# Patient Record
Sex: Male | Born: 1955 | Race: White | Hispanic: No | Marital: Married | Smoking: Never smoker
Health system: Southern US, Community
[De-identification: ages and names within clinical notes are randomized; demographics above are authoritative.]

## PROBLEM LIST (undated history)

## (undated) DIAGNOSIS — H9190 Unspecified hearing loss, unspecified ear: Secondary | ICD-10-CM

## (undated) DIAGNOSIS — D126 Benign neoplasm of colon, unspecified: Secondary | ICD-10-CM

## (undated) DIAGNOSIS — E7800 Pure hypercholesterolemia, unspecified: Secondary | ICD-10-CM

## (undated) DIAGNOSIS — N289 Disorder of kidney and ureter, unspecified: Secondary | ICD-10-CM

## (undated) DIAGNOSIS — K529 Noninfective gastroenteritis and colitis, unspecified: Secondary | ICD-10-CM

## (undated) DIAGNOSIS — N189 Chronic kidney disease, unspecified: Secondary | ICD-10-CM

## (undated) HISTORY — DX: Disorder of kidney and ureter, unspecified: N28.9

## (undated) HISTORY — PX: COLONOSCOPY: SHX174

## (undated) HISTORY — DX: Chronic kidney disease, unspecified: N18.9

## (undated) HISTORY — PX: KIDNEY STONE SURGERY: SHX686

## (undated) HISTORY — DX: Pure hypercholesterolemia, unspecified: E78.00

## (undated) HISTORY — DX: Noninfective gastroenteritis and colitis, unspecified: K52.9

## (undated) HISTORY — PX: NASAL SEPTUM SURGERY: SHX37

## (undated) HISTORY — DX: Benign neoplasm of colon, unspecified: D12.6

---

## 1999-02-02 ENCOUNTER — Encounter: Admission: RE | Admit: 1999-02-02 | Discharge: 1999-02-02 | Payer: Self-pay | Admitting: Sports Medicine

## 2000-03-07 ENCOUNTER — Encounter: Admission: RE | Admit: 2000-03-07 | Discharge: 2000-03-07 | Payer: Self-pay | Admitting: Sports Medicine

## 2000-03-08 ENCOUNTER — Ambulatory Visit (HOSPITAL_COMMUNITY): Admission: RE | Admit: 2000-03-08 | Discharge: 2000-03-08 | Payer: Self-pay | Admitting: Sports Medicine

## 2005-10-05 ENCOUNTER — Encounter: Admission: RE | Admit: 2005-10-05 | Discharge: 2005-10-05 | Payer: Self-pay | Admitting: Gastroenterology

## 2014-07-25 DIAGNOSIS — H9190 Unspecified hearing loss, unspecified ear: Secondary | ICD-10-CM

## 2014-07-25 HISTORY — DX: Unspecified hearing loss, unspecified ear: H91.90

## 2015-09-09 ENCOUNTER — Other Ambulatory Visit: Payer: Self-pay | Admitting: Gastroenterology

## 2015-09-14 ENCOUNTER — Other Ambulatory Visit (HOSPITAL_COMMUNITY): Payer: Self-pay | Admitting: Gastroenterology

## 2015-09-14 DIAGNOSIS — R0789 Other chest pain: Secondary | ICD-10-CM

## 2015-09-18 ENCOUNTER — Telehealth (HOSPITAL_COMMUNITY): Payer: Self-pay

## 2015-09-18 NOTE — Telephone Encounter (Signed)
Encounter complete. 

## 2015-09-22 ENCOUNTER — Telehealth (HOSPITAL_COMMUNITY): Payer: Self-pay

## 2015-09-22 NOTE — Telephone Encounter (Signed)
Encounter complete. 

## 2015-09-23 ENCOUNTER — Ambulatory Visit (HOSPITAL_COMMUNITY)
Admission: RE | Admit: 2015-09-23 | Discharge: 2015-09-23 | Disposition: A | Discharge status: Home / Self Care | Payer: BLUE CROSS/BLUE SHIELD | Source: Ambulatory Visit | Attending: Cardiology | Admitting: Cardiology

## 2015-09-23 DIAGNOSIS — R0789 Other chest pain: Secondary | ICD-10-CM | POA: Insufficient documentation

## 2015-09-23 LAB — EXERCISE TOLERANCE TEST
CHL CUP STRESS STAGE 1 DBP: 91 mmHg
CHL CUP STRESS STAGE 1 GRADE: 0 %
CHL CUP STRESS STAGE 1 SBP: 138 mmHg
CHL CUP STRESS STAGE 1 SPEED: 0 mph
CHL CUP STRESS STAGE 10 GRADE: 0 %
CHL CUP STRESS STAGE 10 HR: 72 {beats}/min
CHL CUP STRESS STAGE 10 SBP: 176 mmHg
CHL CUP STRESS STAGE 2 HR: 56 {beats}/min
CHL CUP STRESS STAGE 3 HR: 56 {beats}/min
CHL CUP STRESS STAGE 3 SPEED: 1 mph
CHL CUP STRESS STAGE 4 DBP: 75 mmHg
CHL CUP STRESS STAGE 4 HR: 80 {beats}/min
CHL CUP STRESS STAGE 4 SBP: 140 mmHg
CHL CUP STRESS STAGE 4 SPEED: 1.7 mph
CHL CUP STRESS STAGE 5 DBP: 80 mmHg
CHL CUP STRESS STAGE 5 GRADE: 12 %
CHL CUP STRESS STAGE 5 HR: 91 {beats}/min
CHL CUP STRESS STAGE 5 SBP: 159 mmHg
CHL CUP STRESS STAGE 6 DBP: 76 mmHg
CHL CUP STRESS STAGE 6 SBP: 166 mmHg
CHL CUP STRESS STAGE 7 HR: 123 {beats}/min
CHL CUP STRESS STAGE 7 SPEED: 4.2 mph
CHL CUP STRESS STAGE 8 SBP: 227 mmHg
CHL CUP STRESS STAGE 8 SPEED: 5 mph
CHL CUP STRESS STAGE 9 HR: 120 {beats}/min
CHL CUP STRESS STAGE 9 SPEED: 0 mph
CHL RATE OF PERCEIVED EXERTION: 16
CSEPPMHR: 94 %
Estimated workload: 17.2 METS
Exercise duration (min): 14 min
MPHR: 159 {beats}/min
Peak BP: 227 mmHg
Peak HR: 150 {beats}/min
Percent HR: 94 %
Rest HR: 56 {beats}/min
Stage 1 HR: 56 {beats}/min
Stage 10 DBP: 90 mmHg
Stage 10 Speed: 0 mph
Stage 2 Grade: 0 %
Stage 2 Speed: 1 mph
Stage 3 Grade: 0.1 %
Stage 4 Grade: 10 %
Stage 5 Speed: 2.5 mph
Stage 6 Grade: 14 %
Stage 6 HR: 97 {beats}/min
Stage 6 Speed: 3.4 mph
Stage 7 DBP: 83 mmHg
Stage 7 Grade: 16 %
Stage 7 SBP: 217 mmHg
Stage 8 DBP: 129 mmHg
Stage 8 Grade: 18 %
Stage 8 HR: 150 {beats}/min
Stage 9 DBP: 124 mmHg
Stage 9 Grade: 0 %
Stage 9 SBP: 222 mmHg

## 2015-10-26 ENCOUNTER — Encounter (HOSPITAL_COMMUNITY): Payer: Self-pay | Admitting: *Deleted

## 2015-11-02 ENCOUNTER — Encounter (HOSPITAL_COMMUNITY): Payer: Self-pay | Admitting: *Deleted

## 2015-11-02 ENCOUNTER — Ambulatory Visit (HOSPITAL_COMMUNITY): Payer: BLUE CROSS/BLUE SHIELD | Admitting: Certified Registered Nurse Anesthetist

## 2015-11-02 ENCOUNTER — Encounter (HOSPITAL_COMMUNITY)
Admission: RE | Disposition: A | Discharge status: Home / Self Care | Payer: Self-pay | Source: Ambulatory Visit | Attending: Gastroenterology

## 2015-11-02 ENCOUNTER — Ambulatory Visit (HOSPITAL_COMMUNITY)
Admission: RE | Admit: 2015-11-02 | Discharge: 2015-11-02 | Disposition: A | Discharge status: Home / Self Care | Payer: BLUE CROSS/BLUE SHIELD | Source: Ambulatory Visit | Attending: Gastroenterology | Admitting: Gastroenterology

## 2015-11-02 DIAGNOSIS — Z1211 Encounter for screening for malignant neoplasm of colon: Secondary | ICD-10-CM | POA: Diagnosis not present

## 2015-11-02 DIAGNOSIS — D123 Benign neoplasm of transverse colon: Secondary | ICD-10-CM | POA: Insufficient documentation

## 2015-11-02 HISTORY — DX: Unspecified hearing loss, unspecified ear: H91.90

## 2015-11-02 HISTORY — PX: COLONOSCOPY WITH PROPOFOL: SHX5780

## 2015-11-02 SURGERY — COLONOSCOPY WITH PROPOFOL
Anesthesia: Monitor Anesthesia Care

## 2015-11-02 MED ORDER — LACTATED RINGERS IV SOLN
INTRAVENOUS | Status: DC
  Administered 2015-11-02: 1000 mL via INTRAVENOUS

## 2015-11-02 MED ORDER — ONDANSETRON HCL 4 MG/2ML IJ SOLN
INTRAMUSCULAR | Status: DC | PRN
  Administered 2015-11-02: 4 mg via INTRAVENOUS

## 2015-11-02 MED ORDER — LIDOCAINE HCL (CARDIAC) 20 MG/ML IV SOLN
INTRAVENOUS | Status: AC
  Filled 2015-11-02: qty 5

## 2015-11-02 MED ORDER — PROPOFOL 500 MG/50ML IV EMUL
INTRAVENOUS | Status: DC | PRN
  Administered 2015-11-02: 75 ug/kg/min via INTRAVENOUS

## 2015-11-02 MED ORDER — SODIUM CHLORIDE 0.9 % IV SOLN: INTRAVENOUS | Status: DC

## 2015-11-02 MED ORDER — PROPOFOL 10 MG/ML IV BOLUS
INTRAVENOUS | Status: AC
  Filled 2015-11-02: qty 60

## 2015-11-02 MED ORDER — LIDOCAINE HCL (CARDIAC) 20 MG/ML IV SOLN
INTRAVENOUS | Status: DC | PRN
  Administered 2015-11-02: 100 mg via INTRAVENOUS

## 2015-11-02 MED ORDER — ONDANSETRON HCL 4 MG/2ML IJ SOLN
INTRAMUSCULAR | Status: AC
  Filled 2015-11-02: qty 2

## 2015-11-02 MED ORDER — PROPOFOL 10 MG/ML IV BOLUS
INTRAVENOUS | Status: DC | PRN
  Administered 2015-11-02 (×2): 40 mg via INTRAVENOUS
  Administered 2015-11-02: 30 mg via INTRAVENOUS

## 2015-11-02 SURGICAL SUPPLY — 22 items

## 2015-11-02 NOTE — Op Note (Signed)
[facility] Patient Name: Douglas Kirby Procedure Date: 11/02/2015 MRN: [id number] Attending MD: Garlan Fair , MD Date of Birth: May 16, 1956 CSN:  Age: 60 Admit Type: Outpatient Procedure:                Colonoscopy Indications:              Screening for colorectal malignant neoplasm. Normal                            screening colonoscopy was performed on 09/20/2005. Providers:                Garlan Fair, MD, Laverta Baltimore, RN, Corliss Parish, Technician Referring MD:              Medicines:                Propofol per Anesthesia Complications:            No immediate complications. Estimated Blood Loss:     Estimated blood loss: none. Procedure:                Pre-Anesthesia Assessment:                           - Prior to the procedure, a History and Physical                            was performed, and patient medications and                            allergies were reviewed. The patient's tolerance of                            previous anesthesia was also reviewed. The risks                            and benefits of the procedure and the sedation                            options and risks were discussed with the patient.                            All questions were answered, and informed consent                            was obtained. Prior Anticoagulants: The patient has                            taken no previous anticoagulant or antiplatelet                            agents. ASA Grade Assessment: II - A patient with  mild systemic disease. After reviewing the risks                            and benefits, the patient was deemed in                            satisfactory condition to undergo the procedure.                           After obtaining informed consent, the colonoscope                            was passed under direct vision. Throughout the   procedure, the patient's blood pressure, pulse, and                            oxygen saturations were monitored continuously. The                            [id number] scope was introduced through                            the anus and advanced to the the cecum, identified                            by appendiceal orifice and ileocecal valve. The                            colonoscopy was performed without difficulty. The                            patient tolerated the procedure well. The quality                            of the bowel preparation was good. The terminal                            ileum, the ileocecal valve, the appendiceal orifice                            and the rectum were photographed. Scope In: 9:46:52 AM Scope Out: 10:09:39 AM Scope Withdrawal Time: 0 hours 14 minutes 58 seconds  Total Procedure Duration: 0 hours 22 minutes 47 seconds  Findings:      The perianal and digital rectal examinations were normal.      A 3 mm polyp was found in the proximal transverse colon. The polyp was       sessile. The polyp was removed with a cold biopsy forceps. Resection and       retrieval were complete.      The exam was otherwise without abnormality. Impression:               - One 3 mm polyp in the proximal transverse colon,  removed with a cold biopsy forceps. Resected and                            retrieved.                           - The examination was otherwise normal. Moderate Sedation:      N/A- Per Anesthesia Care Recommendation:           - Patient has a contact number available for                            emergencies. The signs and symptoms of potential                            delayed complications were discussed with the                            patient. Return to normal activities tomorrow.                            Written discharge instructions were provided to the                            patient.                            - Repeat colonoscopy date to be determined after                            pending pathology results are reviewed for                            surveillance based on pathology results.                           - Resume previous diet.                           - Continue present medications. Procedure Code(s):        --- Professional ---                           [phone], Colonoscopy, flexible; with biopsy, single                            or multiple Diagnosis Code(s):        --- Professional ---                           Z12.11, Encounter for screening for malignant                            neoplasm of colon                           D12.3, Benign neoplasm of transverse colon (hepatic  flexure or splenic flexure) CPT copyright 2016 American Medical Association. All rights reserved. The codes documented in this report are preliminary and upon coder review may  be revised to meet current compliance requirements. [name], MD Garlan Fair, MD 11/02/2015 10:16:06 AM This report has been signed electronically. Number of Addenda: 0

## 2015-11-02 NOTE — Discharge Instructions (Signed)

## 2015-11-02 NOTE — H&P (Signed)
  Procedure: Screening colonoscopy. Normal screening colonoscopy performed on 09/20/2005  History: The patient is a 60 year old male born 07-May-1956. He is scheduled to undergo a repeat screening colonoscopy today  Medication allergies: None  Past medical history: Surgery for deviated septum  Exam: The patient is alert and lying comfortably on the endoscopy stretcher. Abdomen is soft and nontender to palpation. Lungs are clear to auscultation. Cardiac exam reveals a regular rhythm.  Plan: Proceed with screening colonoscopy

## 2015-11-02 NOTE — Anesthesia Postprocedure Evaluation (Signed)
Anesthesia Post Note  Patient: Douglas Kirby  Procedure(s) Performed: Procedure(s) (LRB): COLONOSCOPY WITH PROPOFOL (N/A)  Patient location during evaluation: Endoscopy Anesthesia Type: MAC Level of consciousness: awake and alert Pain management: pain level controlled Vital Signs Assessment: post-procedure vital signs reviewed and stable Respiratory status: spontaneous breathing, nonlabored ventilation, respiratory function stable and patient connected to nasal cannula oxygen Cardiovascular status: stable and blood pressure returned to baseline Anesthetic complications: no    Last Vitals:  Filed Vitals:   11/02/15 1020 11/02/15 1030  BP: 122/74 132/99  Pulse: 62 52  Temp:    Resp: 14 17    Last Pain: There were no vitals filed for this visit.               Montez Hageman

## 2015-11-02 NOTE — Anesthesia Preprocedure Evaluation (Signed)
Anesthesia Evaluation  Patient identified by MRN, date of birth, ID band Patient awake    Reviewed: Allergy & Precautions, NPO status , Patient's Chart, lab work & pertinent test results  Airway Mallampati: II  TM Distance: >3 FB Neck ROM: Full    Dental no notable dental hx.    Pulmonary neg pulmonary ROS,    Pulmonary exam normal breath sounds clear to auscultation       Cardiovascular negative cardio ROS Normal cardiovascular exam Rhythm:Regular Rate:Normal     Neuro/Psych negative neurological ROS  negative psych ROS   GI/Hepatic negative GI ROS, Neg liver ROS,   Endo/Other  negative endocrine ROS  Renal/GU negative Renal ROS  negative genitourinary   Musculoskeletal negative musculoskeletal ROS (+)   Abdominal   Peds negative pediatric ROS (+)  Hematology negative hematology ROS (+)   Anesthesia Other Findings   Reproductive/Obstetrics negative OB ROS                             Anesthesia Physical Anesthesia Plan  ASA: I  Anesthesia Plan: MAC   Post-op Pain Management:    Induction: Intravenous  Airway Management Planned: Simple Face Mask  Additional Equipment:   Intra-op Plan:   Post-operative Plan:   Informed Consent: I have reviewed the patients History and Physical, chart, labs and discussed the procedure including the risks, benefits and alternatives for the proposed anesthesia with the patient or authorized representative who has indicated his/her understanding and acceptance.   Dental advisory given  Plan Discussed with: CRNA  Anesthesia Plan Comments:         Anesthesia Quick Evaluation  

## 2015-11-02 NOTE — Transfer of Care (Signed)
Immediate Anesthesia Transfer of Care Note  Patient: Douglas Kirby  Procedure(s) Performed: Procedure(s): COLONOSCOPY WITH PROPOFOL (N/A)  Patient Location: ENDO  Anesthesia Type:MAC  Level of Consciousness:  sedated, patient cooperative and responds to stimulation  Airway & Oxygen Therapy:Patient Spontanous Breathing and Patient connected to face mask oxgen  Post-op Assessment:  Report given to ENDO RN and Post -op Vital signs reviewed and stable  Post vital signs:  Reviewed and stable  Last Vitals:  Filed Vitals:   11/02/15 0834  BP: 152/96  Pulse: 54  Temp: 36.6 C  Resp: 12    Complications: No apparent anesthesia complications

## 2015-11-04 ENCOUNTER — Encounter (HOSPITAL_COMMUNITY): Payer: Self-pay | Admitting: Gastroenterology

## 2017-01-03 ENCOUNTER — Encounter: Payer: Self-pay | Admitting: Internal Medicine

## 2017-02-16 ENCOUNTER — Encounter: Payer: Self-pay | Admitting: *Deleted

## 2017-03-06 ENCOUNTER — Ambulatory Visit: Payer: BLUE CROSS/BLUE SHIELD | Admitting: Internal Medicine

## 2017-03-06 NOTE — Progress Notes (Signed)
No show letter sent.

## 2018-03-08 ENCOUNTER — Encounter: Payer: Self-pay | Admitting: Gastroenterology

## 2018-03-16 ENCOUNTER — Ambulatory Visit: Payer: BLUE CROSS/BLUE SHIELD | Admitting: Gastroenterology

## 2018-03-20 ENCOUNTER — Ambulatory Visit (INDEPENDENT_AMBULATORY_CARE_PROVIDER_SITE_OTHER): Payer: PRIVATE HEALTH INSURANCE | Admitting: Internal Medicine

## 2018-03-20 ENCOUNTER — Encounter: Payer: Self-pay | Admitting: Internal Medicine

## 2018-03-20 ENCOUNTER — Other Ambulatory Visit (INDEPENDENT_AMBULATORY_CARE_PROVIDER_SITE_OTHER): Payer: PRIVATE HEALTH INSURANCE

## 2018-03-20 VITALS — BP 120/80 | HR 60 | Ht 68.0 in | Wt 185.0 lb

## 2018-03-20 DIAGNOSIS — R195 Other fecal abnormalities: Secondary | ICD-10-CM | POA: Diagnosis not present

## 2018-03-20 DIAGNOSIS — R911 Solitary pulmonary nodule: Secondary | ICD-10-CM

## 2018-03-20 DIAGNOSIS — R140 Abdominal distension (gaseous): Secondary | ICD-10-CM | POA: Diagnosis not present

## 2018-03-20 DIAGNOSIS — R5383 Other fatigue: Secondary | ICD-10-CM

## 2018-03-20 LAB — CBC WITH DIFFERENTIAL/PLATELET
BASOS ABS: 0 10*3/uL (ref 0.0–0.1)
Basophils Relative: 0.5 % (ref 0.0–3.0)
Eosinophils Absolute: 0.1 10*3/uL (ref 0.0–0.7)
Eosinophils Relative: 2.2 % (ref 0.0–5.0)
HCT: 45.8 % (ref 39.0–52.0)
Hemoglobin: 15.7 g/dL (ref 13.0–17.0)
LYMPHS ABS: 2.3 10*3/uL (ref 0.7–4.0)
Lymphocytes Relative: 43.8 % (ref 12.0–46.0)
MCHC: 34.2 g/dL (ref 30.0–36.0)
MCV: 91.7 fl (ref 78.0–100.0)
MONO ABS: 0.3 10*3/uL (ref 0.1–1.0)
Monocytes Relative: 5.8 % (ref 3.0–12.0)
NEUTROS PCT: 47.7 % (ref 43.0–77.0)
Neutro Abs: 2.5 10*3/uL (ref 1.4–7.7)
Platelets: 255 10*3/uL (ref 150.0–400.0)
RBC: 4.99 Mil/uL (ref 4.22–5.81)
RDW: 13.5 % (ref 11.5–15.5)
WBC: 5.2 10*3/uL (ref 4.0–10.5)

## 2018-03-20 LAB — COMPREHENSIVE METABOLIC PANEL
ALK PHOS: 43 U/L (ref 39–117)
ALT: 21 U/L (ref 0–53)
AST: 19 U/L (ref 0–37)
Albumin: 4.5 g/dL (ref 3.5–5.2)
BILIRUBIN TOTAL: 0.8 mg/dL (ref 0.2–1.2)
BUN: 10 mg/dL (ref 6–23)
CO2: 33 mEq/L — ABNORMAL HIGH (ref 19–32)
Calcium: 9.8 mg/dL (ref 8.4–10.5)
Chloride: 104 mEq/L (ref 96–112)
Creatinine, Ser: 0.97 mg/dL (ref 0.40–1.50)
GFR: 82.93 mL/min (ref >60.00)
GLUCOSE: 113 mg/dL — AB (ref 70–99)
Potassium: 4.6 mEq/L (ref 3.5–5.1)
SODIUM: 140 meq/L (ref 135–145)
TOTAL PROTEIN: 7.3 g/dL (ref 6.0–8.3)

## 2018-03-20 LAB — VITAMIN B12: Vitamin B-12: 226 pg/mL (ref 211–911)

## 2018-03-20 LAB — IBC PANEL
Iron: 124 ug/dL (ref 42–165)
SATURATION RATIOS: 34.1 % (ref 20.0–50.0)
Transferrin: 260 mg/dL (ref 212.0–360.0)

## 2018-03-20 LAB — IGA: IgA: 318 mg/dL (ref 68–378)

## 2018-03-20 LAB — FERRITIN: Ferritin: 79.5 ng/mL (ref 22.0–322.0)

## 2018-03-20 LAB — HIGH SENSITIVITY CRP: CRP, High Sensitivity: 1.51 mg/L (ref 0.000–5.000)

## 2018-03-20 LAB — IRON: IRON: 124 ug/dL (ref 42–165)

## 2018-03-20 LAB — FOLATE: Folate: 10.5 ng/mL (ref >5.9)

## 2018-03-20 NOTE — Patient Instructions (Signed)
You have been scheduled for a CT scan of the abdomen and pelvis at [facility] ([address]---this is in the same building as Press [facility]).   You are scheduled on 03/28/18 at 10:15 am. You should arrive 15 minutes prior to your appointment time for registration. Please follow the written instructions below on the day of your exam:  Plan on being at [facility] for 30 minutes or longer, depending on the type of exam you are having performed.  This test typically takes 30-45 minutes to complete.  If you have any questions regarding your exam or if you need to reschedule, you may call the CT department at [phone] between the hours of 8:00 am and 5:00 pm, Monday-Friday.  ________________________________________________________________________  Dennis Bast have been given a testing kit to check for small intestine bacterial overgrowth (SIBO) which is completed by a company named [facility]. Make sure to return your test in the mail using the return mailing label given you along with the kit. Your demographic and insurance information have already been sent to the company and they should be in contact with you over the next week regarding this test. Please keep in mind that you will be getting a call from phone number [phone] or a similar number. If you do not hear from them within this time frame, please call our office at [phone].  ________________________________________________________________________  Your provider has requested that you go to the basement level for lab work before leaving today. Press "B" on the elevator. The lab is located at the first door on the left as you exit the elevator.  If you are age 17 or older, your body mass index should be between 23-30. Your Body mass index is 28.13 kg/m. If this is out of the aforementioned range listed, please consider follow up with your Primary Care Provider.  If you are age 23 or younger, your body  mass index should be between 19-25. Your Body mass index is 28.13 kg/m. If this is out of the aformentioned range listed, please consider follow up with your Primary Care Provider.

## 2018-03-20 NOTE — Progress Notes (Signed)
Patient ID: Douglas Douglas Kirby, male   DOB: 1955-11-19, 62 y.o.   MRN: [id number] HPI: Douglas Douglas Kirby is a 62 year old male with a past medical history of adenomatous colon polyp, chronic loose stools, hypercholesterolemia, history of kidney stone who is seen in consultation at the request of Dr. Earle Kirby to evaluate abdominal bloating and loose stools.  He is here alone today.  He was followed by Dr. Earle Kirby 4 years prior to Dr. Durenda Kirby retirement last year.  He was last seen in the office by Dr. Wynetta Douglas Kirby on 09/09/2015.  His last colonoscopy was 11/02/2015.  This colonoscopy revealed a 3 mm transverse colon polyp removed with cold forceps.  This was found to be a tubular adenoma without high-grade dysplasia.  He has been dealing with abdominal bloating, loose stools, abdominal tenderness.  Fatigue is also been an issue for him.  Loose stools date back many years but the abdominal bloating is more over the last 6 to 12 months.  He also had a trip to [place] in September 2018 and loose stools seem to worsen after this trip.  He was previously also treated with antibiotics around the time of a kidney stone also changing his bowel pattern.  He has been seeing Douglas Douglas Kirby a nutritionist in [place].  She last saw him on 02/23/2018 and I reviewed her last note.  She is focused on modifying his diet and currently he is having a very restricted diet and avoiding dairy, soy, wheat, corn, processed foods and alcohol.  She is recommended that he try to remove toxins, alcohol and stress.  Replace with organic unprocessed whole foods.  She has recommended a FODMAP diet.  Also work with sleep schedule and stress reducers.  Since instituting some of the dietary restrictions he has noticed that stools have become more formed though at times can still be loose.  Bloating and fatigue have been an issue.  He does seem to respond favorably to foods such as Brussels sprouts, asparagus, fruit smoothies and almond  butter.  He did have a CT scan of his abdomen in January 2018.  This was with contrast.  It did show pulmonary nodule on the left which follow-up was recommended with repeat imaging.  The GI tract was unremarkable.   Past Medical History:  Diagnosis Date  . Chronic diarrhea   . Hypercholesterolemia   . Loss of hearing 2016  . Renal disease   . Tubular adenoma of colon     Past Surgical History:  Procedure Laterality Date  . COLONOSCOPY WITH PROPOFOL N/A 11/02/2015   Procedure: COLONOSCOPY WITH PROPOFOL;  Surgeon: Douglas Fair, MD;  Location: [facility];  Service: Endoscopy;  Laterality: N/A;  . KIDNEY STONE SURGERY    . NASAL SEPTUM SURGERY      No outpatient medications prior to visit.   No facility-administered medications prior to visit.     No Known Allergies  Family History  Problem Relation Kirby of Onset  . Scleroderma Mother   . Parkinson's disease Father     Social History   Tobacco Use  . Smoking status: Never Smoker  . Smokeless tobacco: Never Used  Substance Use Topics  . Alcohol use: Yes  . Drug use: No    ROS: As per history of present illness, otherwise negative  BP 120/80   Pulse 60   Ht '5\' 8"'$  (1.727 m)   Wt 185 lb (83.9 kg)   BMI 28.13 kg/m  Constitutional: Well-developed and well-nourished. No distress.  HEENT: Normocephalic and atraumatic. Oropharynx is clear and moist. Conjunctivae are normal.  No scleral icterus. Neck: Neck supple. Trachea midline. Cardiovascular: Normal rate, regular rhythm and intact distal pulses. No M/R/G Pulmonary/chest: Effort normal and breath sounds normal. No wheezing, rales or rhonchi. Abdominal: Soft, nontender, nondistended. Bowel sounds active throughout. There are no masses palpable. No hepatosplenomegaly. Extremities: no clubbing, cyanosis, or edema Neurological: Douglas Kirby and oriented to person place and time. Skin: Skin is warm and dry.  Psychiatric: Normal mood and affect. Behavior is  normal.    RELEVANT LABS AND IMAGING: CBC    Component Value Date/Time   WBC 5.2 03/20/2018 1059   RBC 4.99 03/20/2018 1059   HGB 15.7 03/20/2018 1059   HCT 45.8 03/20/2018 1059   PLT 255.0 03/20/2018 1059   MCV 91.7 03/20/2018 1059   MCHC 34.2 03/20/2018 1059   RDW 13.5 03/20/2018 1059   LYMPHSABS 2.3 03/20/2018 1059   MONOABS 0.3 03/20/2018 1059   EOSABS 0.1 03/20/2018 1059   BASOSABS 0.0 03/20/2018 1059    CMP     Component Value Date/Time   NA 140 03/20/2018 1059   K 4.6 03/20/2018 1059   CL 104 03/20/2018 1059   CO2 33 (H) 03/20/2018 1059   GLUCOSE 113 (H) 03/20/2018 1059   BUN 10 03/20/2018 1059   CREATININE 0.97 03/20/2018 1059   CALCIUM 9.8 03/20/2018 1059   PROT 7.3 03/20/2018 1059   ALBUMIN 4.5 03/20/2018 1059   AST 19 03/20/2018 1059   ALT 21 03/20/2018 1059   ALKPHOS 43 03/20/2018 1059   BILITOT 0.8 03/20/2018 1059    ASSESSMENT/PLAN: 62 year old male with a past medical history of adenomatous colon polyp, chronic loose stools, hypercholesterolemia, history of kidney stone who is seen in consultation at the request of Dr. Earle Kirby to evaluate abdominal bloating and loose stools.   1.  Abdominal bloating/loose stools/fatigue --patient is concerned about "leaky gut" and we discussed that gut permeability can be affected by diet, allergies, and certain intestinal diseases such as celiac disease and IBD.  It is also certainly possible that he has IBS loose stool predominance.  Spent time together discussing further evaluation going forward and I recommended: --Celiac test to include TTG and HLA --High-sensitivity CRP --Ova and parasite stool examination given foreign travel; rule out Giardia --H. pylori stool antigen --Iron studies --B12 and folate --CBC and CMP --SIBO breath testing  Once the studies result we we will treat accordingly and if all negative would recommend rifaximin 550 mg 3 times daily x14 days for IBS D  He will continue to work  closely with Douglas Douglas Kirby his nutritionist  2.  History of pulmonary nodule --repeat CT without contrast recommended of the chest to follow-up previously seen pulmonary nodules   PJ:ASNKNLZ, Douglas Alert, Md [address] [place], [place] [address]

## 2018-03-22 ENCOUNTER — Other Ambulatory Visit: Payer: PRIVATE HEALTH INSURANCE

## 2018-03-22 DIAGNOSIS — R140 Abdominal distension (gaseous): Secondary | ICD-10-CM

## 2018-03-22 DIAGNOSIS — R195 Other fecal abnormalities: Secondary | ICD-10-CM

## 2018-03-22 DIAGNOSIS — R5383 Other fatigue: Secondary | ICD-10-CM

## 2018-03-23 LAB — HELICOBACTER PYLORI  SPECIAL ANTIGEN
MICRO NUMBER: 91035836
SPECIMEN QUALITY: ADEQUATE

## 2018-03-23 LAB — OVA AND PARASITE EXAMINATION
CONCENTRATE RESULT: NONE SEEN
MICRO NUMBER:: 91035710
SPECIMEN QUALITY: ADEQUATE
TRICHROME RESULT: NONE SEEN

## 2018-03-24 LAB — HLA TYPING FOR CELIAC DISEASE
HLA-DQ2: POSITIVE
HLA-DQ8: NEGATIVE
HLA-DQA1*: 1
HLA-DQA1*: 5
HLA-DQB1*: 201
HLA-DQB1: 601

## 2018-03-24 LAB — TISSUE TRANSGLUTAMINASE, IGA: (tTG) Ab, IgA: 1 U/mL

## 2018-03-27 ENCOUNTER — Ambulatory Visit: Payer: BLUE CROSS/BLUE SHIELD | Admitting: Internal Medicine

## 2018-03-28 ENCOUNTER — Ambulatory Visit (INDEPENDENT_AMBULATORY_CARE_PROVIDER_SITE_OTHER)
Admission: RE | Admit: 2018-03-28 | Discharge: 2018-03-28 | Disposition: A | Discharge status: Home / Self Care | Payer: PRIVATE HEALTH INSURANCE | Source: Ambulatory Visit | Attending: Internal Medicine | Admitting: Internal Medicine

## 2018-03-28 DIAGNOSIS — R911 Solitary pulmonary nodule: Secondary | ICD-10-CM

## 2018-04-09 ENCOUNTER — Telehealth: Payer: Self-pay | Admitting: Internal Medicine

## 2018-04-09 ENCOUNTER — Other Ambulatory Visit: Payer: Self-pay

## 2018-04-09 DIAGNOSIS — Z7689 Persons encountering health services in other specified circumstances: Secondary | ICD-10-CM

## 2018-04-09 NOTE — Telephone Encounter (Signed)
Let pt know he has all lab results. Referral in epic for PCP, Dr. Arnette Norris. Pt given the office number.

## 2018-04-11 ENCOUNTER — Ambulatory Visit: Payer: PRIVATE HEALTH INSURANCE | Admitting: Family Medicine

## 2018-04-16 ENCOUNTER — Ambulatory Visit: Payer: PRIVATE HEALTH INSURANCE | Admitting: Family Medicine

## 2018-04-20 ENCOUNTER — Encounter: Payer: Self-pay | Admitting: Family Medicine

## 2018-04-20 ENCOUNTER — Ambulatory Visit (INDEPENDENT_AMBULATORY_CARE_PROVIDER_SITE_OTHER): Payer: PRIVATE HEALTH INSURANCE | Admitting: Family Medicine

## 2018-04-20 VITALS — BP 120/80 | HR 52 | Temp 97.9°F | Ht 67.0 in | Wt 186.0 lb

## 2018-04-20 DIAGNOSIS — I2510 Atherosclerotic heart disease of native coronary artery without angina pectoris: Secondary | ICD-10-CM | POA: Diagnosis not present

## 2018-04-20 DIAGNOSIS — Z13220 Encounter for screening for lipoid disorders: Secondary | ICD-10-CM

## 2018-04-20 DIAGNOSIS — Z1159 Encounter for screening for other viral diseases: Secondary | ICD-10-CM

## 2018-04-20 DIAGNOSIS — R0789 Other chest pain: Secondary | ICD-10-CM | POA: Diagnosis not present

## 2018-04-20 LAB — LIPID PANEL
CHOL/HDL RATIO: 4
Cholesterol: 197 mg/dL (ref 0–200)
HDL: 48.8 mg/dL (ref >39.00)
LDL CALC: 119 mg/dL — AB (ref 0–99)
NonHDL: 147.82
Triglycerides: 144 mg/dL (ref 0.0–149.0)
VLDL: 28.8 mg/dL (ref 0.0–40.0)

## 2018-04-20 LAB — TSH: TSH: 2.74 u[IU]/mL (ref 0.35–4.50)

## 2018-04-20 NOTE — Patient Instructions (Addendum)
It was very nice to meet you! We'll be in touch with lab results.

## 2018-04-20 NOTE — Progress Notes (Signed)
Douglas Kirby - 62 y.o. male MRN [id number]  Date of birth: 04-04-1956  Subjective Chief Complaint  Patient presents with  . Hypertension    HPI Douglas Kirby is a 62 y.o. male with history of adenomatous colon and chronic diarrhea followed by GI for this, here today to establish care.  He has concerns about a recent chest CT that he had done for f/u of lung nodule that showed aortic and coronary atherosclerosis.  He would like to see a cardiologist for these findings.  He does feel a little more fatigued at times than he has in the past.  He does also get occasional chest pain but thinks it is more related to GERD.  He denies associated shortness of breath, palpitations, dizziness, nausea or vomiting associated with this.  He is a runner and typically runs a couple of miles per day, he has been limited some by an achilles injury.  He denies chest pain while running.  He had a stress completed in 2017 that was negative. It has been a while since his cholesterol was checked but has been elevated in the past.  He has made significant dietary changes to help improve his digestive issues as well.   ROS:  A comprehensive ROS was completed and negative except as noted per HPI  No Known Allergies  Past Medical History:  Diagnosis Date  . Chronic diarrhea   . Hypercholesterolemia   . Loss of hearing 2016  . Renal disease   . Tubular adenoma of colon     Past Surgical History:  Procedure Laterality Date  . COLONOSCOPY WITH PROPOFOL N/A 11/02/2015   Procedure: COLONOSCOPY WITH PROPOFOL;  Surgeon: Garlan Fair, MD;  Location: [facility];  Service: Endoscopy;  Laterality: N/A;  . KIDNEY STONE SURGERY    . NASAL SEPTUM SURGERY      Social History   Socioeconomic History  . Marital status: Married    Spouse name: Not on file  . Number of children: 1  . Years of education: Not on file  . Highest education level: Not on file  Occupational History  . Not on file  Social Needs    . Financial resource strain: Not on file  . Food insecurity:    Worry: Not on file    Inability: Not on file  . Transportation needs:    Medical: Not on file    Non-medical: Not on file  Tobacco Use  . Smoking status: Never Smoker  . Smokeless tobacco: Never Used  Substance and Sexual Activity  . Alcohol use: Yes  . Drug use: No  . Sexual activity: Not on file  Lifestyle  . Physical activity:    Days per week: Not on file    Minutes per session: Not on file  . Stress: Not on file  Relationships  . Social connections:    Talks on phone: Not on file    Gets together: Not on file    Attends religious service: Not on file    Active member of club or organization: Not on file    Attends meetings of clubs or organizations: Not on file    Relationship status: Not on file  Other Topics Concern  . Not on file  Social History Narrative  . Not on file    Family History  Problem Relation Age of Onset  . Scleroderma Mother   . Parkinson's disease Father     Health Maintenance  Topic Date Due  .  Hepatitis C Screening  1956/03/14  . HIV Screening  08/19/1969  . TETANUS/TDAP  08/19/1973  . INFLUENZA VACCINE  04/09/2019 (Originally 02/22/2018)  . COLONOSCOPY  11/01/2025    ----------------------------------------------------------------------------------------------------------------------------------------------------------------------------------------------------------------- Physical Exam BP 120/80   Pulse (!) 52   Temp 97.9 F (36.6 C) (Oral)   Ht 5\' 7"  (1.702 m)   Wt 186 lb (84.4 kg)   SpO2 97%   BMI 29.13 kg/m   Physical Exam  Constitutional: He is oriented to person, place, and time. He appears well-nourished. No distress.  HENT:  Head: Normocephalic and atraumatic.  Mouth/Throat: Oropharynx is clear and moist.  Eyes: No scleral icterus.  Neck: Neck supple. No thyromegaly present.  Cardiovascular: Normal rate, regular rhythm, normal heart sounds and intact  distal pulses.  Pulmonary/Chest: Effort normal and breath sounds normal.  Musculoskeletal: He exhibits no edema.  Neurological: He is alert and oriented to person, place, and time.  Skin: Skin is warm and dry. No rash noted.  Psychiatric: He has a normal mood and affect. His behavior is normal.    ------------------------------------------------------------------------------------------------------------------------------------------------------------------------------------------------------------------- Assessment and Plan  Atherosclerosis of coronary artery of native heart without angina pectoris -EKG with sinus bradycardia, he is asymptomatic with this and likely attributed to him being active.  Negative stress test in 2017 as well. -Will check lipids for risk stratification. -He would like to see cardiology for findings on recent CT, which is reasonable.  Will place referral.   Screening for lipid disorders Check lipids today.

## 2018-04-20 NOTE — Assessment & Plan Note (Signed)
-  EKG with sinus bradycardia, he is asymptomatic with this and likely attributed to him being active.  Negative stress test in 2017 as well. -Will check lipids for risk stratification. -He would like to see cardiology for findings on recent CT, which is reasonable.  Will place referral.

## 2018-04-20 NOTE — Assessment & Plan Note (Signed)
Check lipids today 

## 2018-04-21 LAB — HEPATITIS C ANTIBODY
HEP C AB: NONREACTIVE
SIGNAL TO CUT-OFF: 0.02 (ref <1.00)

## 2018-04-25 ENCOUNTER — Other Ambulatory Visit: Payer: Self-pay | Admitting: Family Medicine

## 2018-04-25 DIAGNOSIS — I2584 Coronary atherosclerosis due to calcified coronary lesion: Principal | ICD-10-CM

## 2018-04-25 DIAGNOSIS — I2510 Atherosclerotic heart disease of native coronary artery without angina pectoris: Secondary | ICD-10-CM

## 2018-04-25 MED ORDER — ATORVASTATIN CALCIUM 20 MG PO TABS: 20.0000 mg | ORAL_TABLET | Freq: Every day | ORAL | 3 refills | Status: DC

## 2018-04-25 NOTE — Progress Notes (Signed)
-  Cholesterol is elevated. Based on this and coronary artery findings on recent CT scan I would recommend starting a medication to lower his cholesterol and reduce risk of cardiovascular disease.  I will also place a referral to cardiology.  -His other labs are normal.

## 2018-05-17 NOTE — Progress Notes (Signed)
Contacted Lauren at [facility] (phone [phone]) regarding patient's SIBO testing request sent on 03/20/18. Unfortunately, patient has still not returned test kit. She states they will reach back out to patient regarding testing.

## 2018-05-18 ENCOUNTER — Ambulatory Visit (INDEPENDENT_AMBULATORY_CARE_PROVIDER_SITE_OTHER): Payer: PRIVATE HEALTH INSURANCE | Admitting: Cardiovascular Disease

## 2018-05-18 ENCOUNTER — Encounter: Payer: Self-pay | Admitting: Cardiovascular Disease

## 2018-05-18 ENCOUNTER — Encounter: Payer: Self-pay | Admitting: *Deleted

## 2018-05-18 VITALS — BP 122/68 | HR 66 | Ht 67.0 in | Wt 187.4 lb

## 2018-05-18 DIAGNOSIS — I2510 Atherosclerotic heart disease of native coronary artery without angina pectoris: Secondary | ICD-10-CM | POA: Diagnosis not present

## 2018-05-18 NOTE — Progress Notes (Signed)
Chief Complaint  Patient presents with  . New Patient (Initial Visit)    CAD   History of Present Illness: 62 yo male with history of hyperlipidemia, chronic diarrhea, adenomatous colon polyp and nephrolithiasis who is here today as a new consult for the evaluation of an abnormal chest CT which showed coronary artery calcification. He had a chest CT for f/u of pulmonary nodules and coronary calcification was noted. He had a negative exercise stress test in 2017. He has been active and is a runner. He has rare chest pains on the right side. Never with exertion. He has dyspnea when climbing stairs. He has some fatigue. He is very active. He is a runner.   Primary Care Physician: Luetta Nutting, DO  Past Medical History:  Diagnosis Date  . Chronic diarrhea   . Hypercholesterolemia   . Loss of hearing 2016  . Renal disease   . Tubular adenoma of colon     Past Surgical History:  Procedure Laterality Date  . COLONOSCOPY WITH PROPOFOL N/A 11/02/2015   Procedure: COLONOSCOPY WITH PROPOFOL;  Surgeon: Garlan Fair, MD;  Location: [facility];  Service: Endoscopy;  Laterality: N/A;  . KIDNEY STONE SURGERY    . NASAL SEPTUM SURGERY      No current outpatient medications on file.   No current facility-administered medications for this visit.     No Known Allergies  Social History   Socioeconomic History  . Marital status: Married    Spouse name: Not on file  . Number of children: 1  . Years of education: Not on file  . Highest education level: Not on file  Occupational History  . Occupation: Psychiatric nurse  Social Needs  . Financial resource strain: Not on file  . Food insecurity:    Worry: Not on file    Inability: Not on file  . Transportation needs:    Medical: Not on file    Non-medical: Not on file  Tobacco Use  . Smoking status: Never Smoker  . Smokeless tobacco: Never Used  Substance and Sexual Activity  . Alcohol use: Yes    Comment: occasional wine  .  Drug use: No  . Sexual activity: Not on file  Lifestyle  . Physical activity:    Days per week: Not on file    Minutes per session: Not on file  . Stress: Not on file  Relationships  . Social connections:    Talks on phone: Not on file    Gets together: Not on file    Attends religious service: Not on file    Active member of club or organization: Not on file    Attends meetings of clubs or organizations: Not on file    Relationship status: Not on file  . Intimate partner violence:    Fear of current or ex partner: Not on file    Emotionally abused: Not on file    Physically abused: Not on file    Forced sexual activity: Not on file  Other Topics Concern  . Not on file  Social History Narrative  . Not on file    Family History  Problem Relation Age of Onset  . Scleroderma Mother   . Parkinson's disease Father   . CAD Paternal Uncle     Review of Systems:  As stated in the HPI and otherwise negative.   BP 122/68   Pulse 66   Ht 5\' 7"  (1.702 m)   Wt 187 lb 6.4 oz (  85 kg)   SpO2 98%   BMI 29.35 kg/m   Physical Examination: General: Well developed, well nourished, NAD  HEENT: OP clear, mucus membranes moist  SKIN: warm, dry. No rashes. Neuro: No focal deficits  Musculoskeletal: Muscle strength 5/5 all ext  Psychiatric: Mood and affect normal  Neck: No JVD, no carotid bruits, no thyromegaly, no lymphadenopathy.  Lungs:Clear bilaterally, no wheezes, rhonci, crackles Cardiovascular: Regular rate and rhythm. No murmurs, gallops or rubs. Abdomen:Soft. Bowel sounds present. Non-tender.  Extremities: No lower extremity edema. Pulses are 2 + in the bilateral DP/PT.  EKG:  EKG is not ordered today. The ekg ordered today demonstrates  EKG from September 2019 reviewed by me. Sinus brady, rate 46 bpm  Recent Labs: 03/20/2018: ALT 21; BUN 10; Creatinine, Ser 0.97; Hemoglobin 15.7; Platelets 255.0; Potassium 4.6; Sodium 140 04/20/2018: TSH 2.74   Lipid Panel    Component  Value Date/Time   CHOL 197 04/20/2018 1026   TRIG 144.0 04/20/2018 1026   HDL 48.80 04/20/2018 1026   CHOLHDL 4 04/20/2018 1026   VLDL 28.8 04/20/2018 1026   LDLCALC 119 (H) 04/20/2018 1026     Wt Readings from Last 3 Encounters:  05/18/18 187 lb 6.4 oz (85 kg)  04/20/18 186 lb (84.4 kg)  03/20/18 185 lb (83.9 kg)     Other studies Reviewed: Additional studies/ records that were reviewed today include: . Review of the above records demonstrates:    Assessment and Plan:   1. CAD without angina: He was found to have coronary artery calcification on non cardiac CT recently. He is very active and has had recent fatigue and dyspnea with heavy exertion. Rare chest pains. Will arrange echo to assess LVEF and exclude structural heart disease. Will arrange exercise nuclear stress test to exclude ischemia.     Current medicines are reviewed at length with the patient today.  The patient does not have concerns regarding medicines.  The following changes have been made:  no change  Labs/ tests ordered today include:   Orders Placed This Encounter  Procedures  . MYOCARDIAL PERFUSION IMAGING  . ECHOCARDIOGRAM COMPLETE     Disposition:   FU with me in 6 months   Signed, Lauree Chandler, MD 05/18/2018 1:29 PM    [facility] [place], [place], [place]  [address] Phone: [phone]; Fax: [phone]

## 2018-05-18 NOTE — Patient Instructions (Signed)
Medication Instructions:  Your physician recommends that you continue on your current medications as directed. Please refer to the Current Medication list given to you today.  If you need a refill on your cardiac medications before your next appointment, please call your pharmacy.   Lab work: none If you have labs (blood work) drawn today and your tests are completely normal, you will receive your results only by: Marland Kitchen MyChart Message (if you have MyChart) OR . A paper copy in the mail If you have any lab test that is abnormal or we need to change your treatment, we will call you to review the results.  Testing/Procedures: Your physician has requested that you have an echocardiogram. Echocardiography is a painless test that uses sound waves to create images of your heart. It provides your doctor with information about the size and shape of your heart and how well your heart's chambers and valves are working. This procedure takes approximately one hour. There are no restrictions for this procedure.  Your physician has requested that you have an exercise stress myoview. For further information please visit HugeFiesta.[email or web address]. Please follow instruction sheet, as given.   Follow-Up: At [facility], you and your health needs are our priority.  As part of our continuing mission to provide you with exceptional heart care, we have created designated Provider Care Teams.  These Care Teams include your primary Cardiologist (physician) and Advanced Practice Providers (APPs -  Physician Assistants and Nurse Practitioners) who all work together to provide you with the care you need, when you need it. You will need a follow up appointment in 6 months.  Please call our office 2 months in advance to schedule this appointment.  You may see Dr. Angelena Form  or one of the following Advanced Practice Providers on your designated Care Team:   Emmet, PA-C Melina Copa, PA-C . Ermalinda Barrios, PA-C  Any  Other Special Instructions Will Be Listed Below (If Applicable).

## 2018-05-22 ENCOUNTER — Telehealth (HOSPITAL_COMMUNITY): Payer: Self-pay

## 2018-05-22 NOTE — Telephone Encounter (Signed)
Detailed instructions given to the pt. He stated that he understood and would be here. S.Brytney Somes EMTP

## 2018-05-23 ENCOUNTER — Telehealth: Payer: Self-pay | Admitting: Cardiovascular Disease

## 2018-05-23 NOTE — Telephone Encounter (Signed)
Pt had myoview scheduled for 05-24-18 Notified pt auth requested for nuc 05-21-18.  Per Janeece Riggers can take up to 7 to 10 business day.  Pt requested to wait for nuc test auth and will still come for echo 05-24-18.

## 2018-05-24 ENCOUNTER — Encounter (HOSPITAL_COMMUNITY): Payer: PRIVATE HEALTH INSURANCE

## 2018-05-24 ENCOUNTER — Ambulatory Visit (HOSPITAL_COMMUNITY): Payer: PRIVATE HEALTH INSURANCE | Attending: Cardiology

## 2018-05-24 ENCOUNTER — Other Ambulatory Visit: Payer: Self-pay

## 2018-05-24 DIAGNOSIS — I2510 Atherosclerotic heart disease of native coronary artery without angina pectoris: Secondary | ICD-10-CM | POA: Diagnosis not present

## 2018-05-29 ENCOUNTER — Telehealth: Payer: Self-pay | Admitting: Cardiovascular Disease

## 2018-05-29 NOTE — Telephone Encounter (Signed)
Called pt to inform his Douglas Kirby was now approved by his [employer].  Per Nate [phone] valid until 05-22-19 (good for 1 year).  He would like a call on echo results before he schedules his myoview.

## 2018-05-30 ENCOUNTER — Telehealth: Payer: Self-pay | Admitting: *Deleted

## 2018-05-30 DIAGNOSIS — I77810 Thoracic aortic ectasia: Secondary | ICD-10-CM

## 2018-05-30 NOTE — Telephone Encounter (Signed)
-----   Message from Burnell Blanks, MD sent at 05/25/2018 10:44 AM EDT ----- Echo with normal LV systolic function and no valve issues but his aortic root is mildly dilated. I would recommend a chest CTA to evaluate his aorta. Stress test is pending. chris

## 2018-05-30 NOTE — Telephone Encounter (Signed)
See echo note. cdm

## 2018-05-30 NOTE — Telephone Encounter (Signed)
I spoke with pt and reviewed echo results with him.  He will be out of the country from 11[place]10[place]11/19 and would like to wait until he returns to have CTA.  He will come in for BMP on 11/20.  I told pt our scheduling department will contact him to schedule CTA.

## 2018-05-30 NOTE — Telephone Encounter (Signed)
I spoke with pt. He would like to proceed with echo.  Will ask scheduling department to call him to schedule.

## 2018-05-31 NOTE — Telephone Encounter (Signed)
Pt would like to proceed with stress test. (has already had echo)

## 2018-06-12 ENCOUNTER — Telehealth (HOSPITAL_COMMUNITY): Payer: Self-pay | Admitting: *Deleted

## 2018-06-12 NOTE — Telephone Encounter (Signed)
Patient given detailed instructions per Myocardial Perfusion Study Information Sheet for the test on 06/15/18 at 1000. Patient notified to arrive 15 minutes early and that it is imperative to arrive on time for appointment to keep from having the test rescheduled.  If you need to cancel or reschedule your appointment, please call the office within 24 hours of your appointment. . Patient verbalized understanding.Broedy Osbourne, Ranae Palms

## 2018-06-13 ENCOUNTER — Other Ambulatory Visit: Payer: PRIVATE HEALTH INSURANCE | Admitting: *Deleted

## 2018-06-13 DIAGNOSIS — I77810 Thoracic aortic ectasia: Secondary | ICD-10-CM

## 2018-06-14 LAB — BASIC METABOLIC PANEL
BUN/Creatinine Ratio: 11 (ref 10–24)
BUN: 12 mg/dL (ref 8–27)
CO2: 23 mmol/L (ref 20–29)
CREATININE: 1.12 mg/dL (ref 0.76–1.27)
Calcium: 9.1 mg/dL (ref 8.6–10.2)
Chloride: 102 mmol/L (ref 96–106)
GFR calc Af Amer: 80 mL/min/{1.73_m2} (ref >59)
GFR calc non Af Amer: 70 mL/min/{1.73_m2} (ref >59)
GLUCOSE: 156 mg/dL — AB (ref 65–99)
Potassium: 4.2 mmol/L (ref 3.5–5.2)
SODIUM: 142 mmol/L (ref 134–144)

## 2018-06-15 ENCOUNTER — Ambulatory Visit (HOSPITAL_COMMUNITY): Payer: PRIVATE HEALTH INSURANCE | Attending: Cardiology

## 2018-06-15 ENCOUNTER — Ambulatory Visit (INDEPENDENT_AMBULATORY_CARE_PROVIDER_SITE_OTHER)
Admission: RE | Admit: 2018-06-15 | Discharge: 2018-06-15 | Disposition: A | Discharge status: Home / Self Care | Payer: PRIVATE HEALTH INSURANCE | Source: Ambulatory Visit | Attending: Cardiovascular Disease | Admitting: Cardiovascular Disease

## 2018-06-15 DIAGNOSIS — I2510 Atherosclerotic heart disease of native coronary artery without angina pectoris: Secondary | ICD-10-CM | POA: Diagnosis not present

## 2018-06-15 DIAGNOSIS — I77810 Thoracic aortic ectasia: Secondary | ICD-10-CM | POA: Diagnosis not present

## 2018-06-15 LAB — MYOCARDIAL PERFUSION IMAGING
CHL CUP NUCLEAR SRS: 0
CHL CUP NUCLEAR SSS: 0
CSEPED: 12 min
CSEPHR: 96 %
CSEPPHR: 151 {beats}/min
Estimated workload: 14.3 METS
Exercise duration (sec): 30 s
LV dias vol: 83 mL (ref 62–150)
LVSYSVOL: 37 mL
MPHR: 157 {beats}/min
Rest HR: 59 {beats}/min
SDS: 0
TID: 1.07

## 2018-06-15 MED ORDER — IOPAMIDOL (ISOVUE-370) INJECTION 76%
100.0000 mL | Freq: Once | INTRAVENOUS | Status: AC | PRN
  Administered 2018-06-15: 100 mL via INTRAVENOUS

## 2018-06-15 MED ORDER — TECHNETIUM TC 99M TETROFOSMIN IV KIT
32.7000 | PACK | Freq: Once | INTRAVENOUS | Status: AC | PRN
  Administered 2018-06-15: 32.7 via INTRAVENOUS
  Filled 2018-06-15: qty 33

## 2018-06-15 MED ORDER — TECHNETIUM TC 99M TETROFOSMIN IV KIT
10.2000 | PACK | Freq: Once | INTRAVENOUS | Status: AC | PRN
  Administered 2018-06-15: 10.2 via INTRAVENOUS
  Filled 2018-06-15: qty 11

## 2018-10-12 ENCOUNTER — Telehealth: Payer: Self-pay | Admitting: Cardiovascular Disease

## 2018-10-12 NOTE — Telephone Encounter (Signed)
°  Patient states he would like to be tested for covid19.  Patient states he traveled to [place] in November 2019, then to [place] in January 2020.  Complains of GI symptoms, headache, ear pain, weakness.  No PCP.  Advised by [facility] Triage team Karlene Einstein) to have patient contact ED or Urgent Care for instrcutctions  Scheduler connected patient with [facility]  for assessment

## 2019-11-28 ENCOUNTER — Other Ambulatory Visit: Payer: Self-pay

## 2019-11-28 ENCOUNTER — Ambulatory Visit (INDEPENDENT_AMBULATORY_CARE_PROVIDER_SITE_OTHER): Payer: Medicare Other | Admitting: Cardiovascular Disease

## 2019-11-28 ENCOUNTER — Encounter: Payer: Self-pay | Admitting: Cardiovascular Disease

## 2019-11-28 VITALS — BP 124/70 | HR 59 | Ht 67.0 in | Wt 185.0 lb

## 2019-11-28 DIAGNOSIS — I2510 Atherosclerotic heart disease of native coronary artery without angina pectoris: Secondary | ICD-10-CM | POA: Diagnosis not present

## 2019-11-28 DIAGNOSIS — I712 Thoracic aortic aneurysm, without rupture, unspecified: Secondary | ICD-10-CM

## 2019-11-28 MED ORDER — ASPIRIN EC 81 MG PO TBEC: 81.0000 mg | DELAYED_RELEASE_TABLET | Freq: Every day | ORAL | Status: DC

## 2019-11-28 MED ORDER — ROSUVASTATIN CALCIUM 5 MG PO TABS: 5.0000 mg | ORAL_TABLET | ORAL | 3 refills | Status: DC

## 2019-11-28 NOTE — Patient Instructions (Addendum)
Medication Instructions:  Your physician has recommended you make the following change in your medication:  1-START Crestor 5 mg by mouth three times a week 2-START Aspirin 81 mg by mouth daily  *If you need a refill on your cardiac medications before your next appointment, please call your pharmacy*  Lab Work: Your physician recommends that you return for lab work in: 12 weeks for fasting lipid and liver panel  If you have labs (blood work) drawn today and your tests are completely normal, you will receive your results only by: Marland Kitchen MyChart Message (if you have MyChart) OR . A paper copy in the mail If you have any lab test that is abnormal or we need to change your treatment, we will call you to review the results.  Testing/Procedures: Your physician has requested that you have an exercise tolerance test. For further information please visit [email or web address].[email or web address]. Please also follow instruction sheet, as given.  Chest CT Angiography (CTA), is a special type of CT scan that uses a computer to produce multi-dimensional views of major blood vessels throughout the body. In CT angiography, a contrast material is injected through an IV to help visualize the blood vessels  Follow-Up: At [facility], you and your health needs are our priority.  As part of our continuing mission to provide you with exceptional heart care, we have created designated Provider Care Teams.  These Care Teams include your primary Cardiologist (physician) and Advanced Practice Providers (APPs -  Physician Assistants and Nurse Practitioners) who all work together to provide you with the care you need, when you need it.  We recommend signing up for the patient portal called "MyChart".  Sign up information is provided on this After Visit Summary.  MyChart is used to connect with patients for Virtual Visits (Telemedicine).  Patients are able to view lab/test results, encounter notes, upcoming appointments, etc.  Non-urgent  messages can be sent to your provider as well.   To learn more about what you can do with MyChart, go to [email or web address].    Your next appointment:   6 month(s)  The format for your next appointment:   In Person  Provider:   You may see Lauree Chandler, MD or one of the following Advanced Practice Providers on your designated Care Team:    Melina Copa, PA-C  Ermalinda Barrios, PA-C

## 2019-11-28 NOTE — Progress Notes (Signed)
Chief Complaint  Patient presents with  . Follow-up    CAD   History of Present Illness: 64 yo male with history of hyperlipidemia, chronic diarrhea, adenomatous colon polyp and nephrolithiasis who is here today for cardiac follow up. I saw him as a new patient October 2019 for the evaluation of an abnormal chest CT which showed coronary artery calcification. He had a chest CT for f/u of pulmonary nodules and coronary calcification was noted. He had a negative exercise stress test in 2017. He has been active and is a runner. He had rare chest pains on the right side. Never with exertion. He has dyspnea when climbing stairs. Echo October 2019 with LVEF=60-65% with normal wall motion. The aortic root was dilated. Nuclear stress test November 2019 with no ischemia. Chest CTA November 2019 with mild ectasia of the aortic root at 4.0 cm.   He is here today for follow up. The patient denies any dyspnea, palpitations, lower extremity edema, orthopnea, PND, dizziness, near syncope or syncope. Occasional mild chest pain.   Primary Care Physician: Luetta Nutting, DO  Past Medical History:  Diagnosis Date  . Chronic diarrhea   . Hypercholesterolemia   . Loss of hearing 2016  . Renal disease   . Tubular adenoma of colon     Past Surgical History:  Procedure Laterality Date  . COLONOSCOPY WITH PROPOFOL N/A 11/02/2015   Procedure: COLONOSCOPY WITH PROPOFOL;  Surgeon: Garlan Fair, MD;  Location: [facility];  Service: Endoscopy;  Laterality: N/A;  . KIDNEY STONE SURGERY    . NASAL SEPTUM SURGERY      Current Outpatient Medications  Medication Sig Dispense Refill  . aspirin EC 81 MG tablet Take 1 tablet (81 mg total) by mouth daily.    . rosuvastatin (CRESTOR) 5 MG tablet Take 1 tablet (5 mg total) by mouth every Monday, Wednesday, and Friday. 45 tablet 3   No current facility-administered medications for this visit.    No Known Allergies  Social History   Socioeconomic History  .  Marital status: Married    Spouse name: Not on file  . Number of children: 1  . Years of education: Not on file  . Highest education level: Not on file  Occupational History  . Occupation: Psychiatric nurse  Tobacco Use  . Smoking status: Never Smoker  . Smokeless tobacco: Never Used  Substance and Sexual Activity  . Alcohol use: Yes    Comment: occasional wine  . Drug use: No  . Sexual activity: Not on file  Other Topics Concern  . Not on file  Social History Narrative  . Not on file   Social Determinants of Health   Financial Resource Strain:   . Difficulty of Paying Living Expenses:   Food Insecurity:   . Worried About Charity fundraiser in the Last Year:   . Arboriculturist in the Last Year:   Transportation Needs:   . Film/video editor (Medical):   Marland Kitchen Lack of Transportation (Non-Medical):   Physical Activity:   . Days of Exercise per Week:   . Minutes of Exercise per Session:   Stress:   . Feeling of Stress :   Social Connections:   . Frequency of Communication with Friends and Family:   . Frequency of Social Gatherings with Friends and Family:   . Attends Religious Services:   . Active Member of Clubs or Organizations:   . Attends Archivist Meetings:   Marland Kitchen Marital  Status:   Intimate Partner Violence:   . Fear of Current or Ex-Partner:   . Emotionally Abused:   Marland Kitchen Physically Abused:   . Sexually Abused:     Family History  Problem Relation Age of Onset  . Scleroderma Mother   . Parkinson's disease Father   . CAD Paternal Uncle     Review of Systems:  As stated in the HPI and otherwise negative.   BP 124/70   Pulse (!) 59   Ht 5\' 7"  (1.702 m)   Wt 185 lb (83.9 kg)   SpO2 97%   BMI 28.98 kg/m   Physical Examination: General: Well developed, well nourished, NAD  HEENT: OP clear, mucus membranes moist  SKIN: warm, dry. No rashes. Neuro: No focal deficits  Musculoskeletal: Muscle strength 5/5 all ext  Psychiatric: Mood and affect  normal  Neck: No JVD, no carotid bruits, no thyromegaly, no lymphadenopathy.  Lungs:Clear bilaterally, no wheezes, rhonci, crackles Cardiovascular: Regular rate and rhythm. No murmurs, gallops or rubs. Abdomen:Soft. Bowel sounds present. Non-tender.  Extremities: No lower extremity edema. Pulses are 2 + in the bilateral DP/PT.  Echo October 2019:  - Left ventricle: The cavity size was normal. Systolic function was  normal. The estimated ejection fraction was in the range of 60%  to 65%. Wall motion was normal; there were no regional wall  motion abnormalities.  - Aortic valve: Trileaflet; normal thickness, mildly calcified  leaflets.  - Aorta: Aortic root dimension: 44 mm (ED). Ascending aortic  diameter: 39 mm (S).  - Aortic root: The aortic root was moderately dilated.  - Ascending aorta: The ascending aorta was mildly dilated.  - Mitral valve: Calcified annulus.  - Tricuspid valve: There was trivial regurgitation.  - Pulmonic valve: There was trivial regurgitation.   EKG:  EKG is ordered today. The ekg ordered today demonstrates Sinus  Recent Labs: No results found for requested labs within last 8760 hours.   Lipid Panel    Component Value Date/Time   CHOL 197 04/20/2018 1026   TRIG 144.0 04/20/2018 1026   HDL 48.80 04/20/2018 1026   CHOLHDL 4 04/20/2018 1026   VLDL 28.8 04/20/2018 1026   LDLCALC 119 (H) 04/20/2018 1026     Wt Readings from Last 3 Encounters:  11/28/19 185 lb (83.9 kg)  05/18/18 187 lb 6.4 oz (85 kg)  04/20/18 186 lb (84.4 kg)     Other studies Reviewed: Additional studies/ records that were reviewed today include: . Review of the above records demonstrates:    Assessment and Plan:   1. CAD without angina: He was found to have coronary artery calcification on non cardiac CT in 2019. No exertional chest pain. No ischemia on nuclear stress test in November 2019. LV function normal. He continues to do well. He exercises every day. He is  a runner. Will start ASA 81 mg daily and Crestor 5 mg three times weekly. Exercise stress test. Lipids and LFTs in 12 weeks.    2. Thoracic aortic aneurysm: Subtle ectasia of the aortic root at 4.0 cm by chest CTA in November 2019. Repeat chest CTA November 2021.    Current medicines are reviewed at length with the patient today.  The patient does not have concerns regarding medicines.  The following changes have been made:  no change  Labs/ tests ordered today include:   Orders Placed This Encounter  Procedures  . CT ANGIO CHEST AORTA W/CM & OR WO/CM  . Lipid panel  . Hepatic function  panel  . EXERCISE TOLERANCE TEST (ETT)     Disposition:   FU with me in 12 months   Signed, Lauree Chandler, MD 11/29/2019 8:11 AM    [facility], [place], [place]  [address] Phone: [phone]; Fax: [phone]

## 2019-11-29 ENCOUNTER — Telehealth: Payer: Self-pay

## 2019-11-29 NOTE — Addendum Note (Signed)
Addended by: Aris Georgia, Lile Mccurley L on: 11/29/2019 04:45 PM   Modules accepted: Orders

## 2019-11-29 NOTE — Telephone Encounter (Signed)
**Note De-Identified Marguerita Stapp Obfuscation** I did a Rosuvastatin PA through covermymeds and received the following message:  Douglas Kirby Key: [facility] - Rx #: [id number] Need help? Call us at [phone]  Outcome   The patient currently has access to the requested medication and a Prior Authorization is not needed for the patient/medication.  DrugRosuvastatin Calcium 5MG  tablets  [employer] Electronic PA Form (2017 NCPDP)   I have notified [name] that this PA is not required. Per pharmacist they are not contracted by the pts ins plan (that is why the PA request was generated) and that the pt will need to have his Rosuvatatin filled at another pharmacy. I advised [facility] that I woud contact the pt to discuss.  I s/w the pt who verbalized understanding and states that he will contact [name] and discuss transfer of Rosuvastatin RX to another pharmacy or he will pay OOP at [name] for his med. He thanked me for calling him with with this information.

## 2020-01-17 ENCOUNTER — Other Ambulatory Visit (HOSPITAL_COMMUNITY): Payer: Medicare Other

## 2020-01-21 ENCOUNTER — Inpatient Hospital Stay: Admission: RE | Admit: 2020-01-21 | Payer: Medicare Other | Source: Ambulatory Visit

## 2020-03-17 ENCOUNTER — Encounter: Payer: Self-pay | Admitting: Cardiovascular Disease

## 2020-04-01 ENCOUNTER — Telehealth: Payer: Self-pay | Admitting: Cardiovascular Disease

## 2020-04-01 NOTE — Telephone Encounter (Signed)
See letter below:  [facility] [address], [place], [place]  [address] Phone:  [phone]   Fax:  [phone]  March 17, 2020   [facility] [address] [place] [address] [place] [address]   Dear Douglas Kirby,   Our office, [facility], has made several attempts to contact you in order to schedule the EXERCISE TOLERANCE TEST  that Dr. Angelena Form had ordered.  Please call us [phone] to schedule this test.  If we have had no response in 10 days, your order will be cancelled and your physician will be notified.  We look forward to hearing from you and participating in your health care needs.  Sincerely,  [facility] Scheduling Team  Patient has not responded, order will be closed. Marland Kitchen

## 2020-06-05 ENCOUNTER — Encounter: Payer: Self-pay | Admitting: Cardiovascular Disease

## 2020-06-10 ENCOUNTER — Other Ambulatory Visit: Payer: Self-pay | Admitting: *Deleted

## 2020-06-10 DIAGNOSIS — Z01812 Encounter for preprocedural laboratory examination: Secondary | ICD-10-CM

## 2020-06-12 ENCOUNTER — Other Ambulatory Visit: Payer: Medicare Other | Admitting: *Deleted

## 2020-06-12 ENCOUNTER — Other Ambulatory Visit: Payer: Self-pay

## 2020-06-12 DIAGNOSIS — I2510 Atherosclerotic heart disease of native coronary artery without angina pectoris: Secondary | ICD-10-CM

## 2020-06-12 DIAGNOSIS — I712 Thoracic aortic aneurysm, without rupture, unspecified: Secondary | ICD-10-CM

## 2020-06-12 DIAGNOSIS — Z01812 Encounter for preprocedural laboratory examination: Secondary | ICD-10-CM

## 2020-06-12 LAB — BASIC METABOLIC PANEL
BUN/Creatinine Ratio: 13 (ref 10–24)
BUN: 13 mg/dL (ref 8–27)
CO2: 24 mmol/L (ref 20–29)
Calcium: 9.1 mg/dL (ref 8.6–10.2)
Chloride: 102 mmol/L (ref 96–106)
Creatinine, Ser: 0.99 mg/dL (ref 0.76–1.27)
GFR calc Af Amer: 92 mL/min/{1.73_m2} (ref >59)
GFR calc non Af Amer: 80 mL/min/{1.73_m2} (ref >59)
Glucose: 123 mg/dL — ABNORMAL HIGH (ref 65–99)
Potassium: 4.3 mmol/L (ref 3.5–5.2)
Sodium: 139 mmol/L (ref 134–144)

## 2020-06-12 LAB — HEPATIC FUNCTION PANEL
ALT: 23 IU/L (ref 0–44)
AST: 21 IU/L (ref 0–40)
Albumin: 4.4 g/dL (ref 3.8–4.8)
Alkaline Phosphatase: 54 IU/L (ref 44–121)
Bilirubin Total: 0.4 mg/dL (ref 0.0–1.2)
Bilirubin, Direct: <0.1 mg/dL (ref 0.00–0.40)
Total Protein: 7.1 g/dL (ref 6.0–8.5)

## 2020-06-12 LAB — LIPID PANEL
Chol/HDL Ratio: 4.6 ratio (ref 0.0–5.0)
Cholesterol, Total: 222 mg/dL — ABNORMAL HIGH (ref 100–199)
HDL: 48 mg/dL (ref >39)
LDL Chol Calc (NIH): 150 mg/dL — ABNORMAL HIGH (ref 0–99)
Triglycerides: 133 mg/dL (ref 0–149)
VLDL Cholesterol Cal: 24 mg/dL (ref 5–40)

## 2020-06-15 ENCOUNTER — Ambulatory Visit
Admission: RE | Admit: 2020-06-15 | Discharge: 2020-06-15 | Disposition: A | Discharge status: Home / Self Care | Payer: Medicare Other | Source: Ambulatory Visit | Attending: Cardiovascular Disease | Admitting: Cardiovascular Disease

## 2020-06-15 ENCOUNTER — Telehealth: Payer: Self-pay | Admitting: *Deleted

## 2020-06-15 ENCOUNTER — Other Ambulatory Visit: Payer: Self-pay

## 2020-06-15 DIAGNOSIS — I712 Thoracic aortic aneurysm, without rupture, unspecified: Secondary | ICD-10-CM

## 2020-06-15 DIAGNOSIS — I2510 Atherosclerotic heart disease of native coronary artery without angina pectoris: Secondary | ICD-10-CM

## 2020-06-15 MED ORDER — IOPAMIDOL (ISOVUE-370) INJECTION 76%
75.0000 mL | Freq: Once | INTRAVENOUS | Status: AC | PRN
  Administered 2020-06-15: 75 mL via INTRAVENOUS

## 2020-06-15 NOTE — Telephone Encounter (Signed)
Called patient to review cta and lab results. He did not start statin.  Does not want to start now.  Wants me to let Dr. Angelena Form know that he has not been compliant with the recommendations.   Reports being short winded sometimes after a flight of stairs, with a pain around his pectoral muscle.  Also reports ran his usual 5 miles today and felt great.  He'd like to discuss this further with Dr. Angelena Form.  I have a hold spot 07/22/20 that patient would like to have.  He has to check w his wife that they wont be out of town first.  He will call back Tuesday.

## 2020-06-16 NOTE — Telephone Encounter (Signed)
Patient states he will not be able to come 07/22/2020, because they will not be back in town.

## 2021-06-22 ENCOUNTER — Encounter: Payer: Self-pay | Admitting: Cardiovascular Disease

## 2021-06-27 NOTE — Progress Notes (Deleted)
No chief complaint on file.  History of Present Illness: 65 yo male with history of CAD, hyperlipidemia, chronic diarrhea, adenomatous colon polyp and nephrolithiasis who is here today for cardiac follow up. I saw him as a new patient October 2019 for the evaluation of an abnormal chest CT which showed coronary artery calcification. He had a chest CT for f/u of pulmonary nodules and coronary calcification was noted. He had a negative exercise stress test in 2017. He has been active and is a runner. He had rare chest pains on the right side. Never with exertion. He has dyspnea when climbing stairs. Echo October 2019 with LVEF=60-65% with normal wall motion. The aortic root was dilated. Nuclear stress test November 2019 with no ischemia. Chest CTA November 2019 with mild ectasia of the aortic root at 4.0 cm. Repeat chest CTA November 2021 with stable 4.0 cm aortic root.   He is here today for follow up. The patient denies any chest pain, dyspnea, palpitations, lower extremity edema, orthopnea, PND, dizziness, near syncope or syncope.   Primary Care Physician: Luetta Nutting, DO  Past Medical History:  Diagnosis Date   Chronic diarrhea    Hypercholesterolemia    Loss of hearing 2016   Renal disease    Tubular adenoma of colon     Past Surgical History:  Procedure Laterality Date   COLONOSCOPY WITH PROPOFOL N/A 11/02/2015   Procedure: COLONOSCOPY WITH PROPOFOL;  Surgeon: Garlan Fair, MD;  Location: [facility];  Service: Endoscopy;  Laterality: N/A;   KIDNEY STONE SURGERY     NASAL SEPTUM SURGERY      Current Outpatient Medications  Medication Sig Dispense Refill   aspirin EC 81 MG tablet Take 1 tablet (81 mg total) by mouth daily.     rosuvastatin (CRESTOR) 5 MG tablet Take 1 tablet (5 mg total) by mouth every Monday, Wednesday, and Friday. 45 tablet 3   No current facility-administered medications for this visit.    No Known Allergies  Social History   Socioeconomic History    Marital status: Married    Spouse name: Not on file   Number of children: 1   Years of education: Not on file   Highest education level: Not on file  Occupational History   Occupation: Psychiatric nurse  Tobacco Use   Smoking status: Never   Smokeless tobacco: Never  Vaping Use   Vaping Use: Never used  Substance and Sexual Activity   Alcohol use: Yes    Comment: occasional wine   Drug use: No   Sexual activity: Not on file  Other Topics Concern   Not on file  Social History Narrative   Not on file   Social Determinants of Health   Financial Resource Strain: Not on file  Food Insecurity: Not on file  Transportation Needs: Not on file  Physical Activity: Not on file  Stress: Not on file  Social Connections: Not on file  Intimate Partner Violence: Not on file    Family History  Problem Relation Age of Onset   Scleroderma Mother    Parkinson's disease Father    CAD Paternal Uncle     Review of Systems:  As stated in the HPI and otherwise negative.   There were no vitals taken for this visit.  Physical Examination: General: Well developed, well nourished, NAD  HEENT: OP clear, mucus membranes moist  SKIN: warm, dry. No rashes. Neuro: No focal deficits  Musculoskeletal: Muscle strength 5/5 all ext  Psychiatric: Mood  and affect normal  Neck: No JVD, no carotid bruits, no thyromegaly, no lymphadenopathy.  Lungs:Clear bilaterally, no wheezes, rhonci, crackles Cardiovascular: Regular rate and rhythm. No murmurs, gallops or rubs. Abdomen:Soft. Bowel sounds present. Non-tender.  Extremities: No lower extremity edema. Pulses are 2 + in the bilateral DP/PT.  Echo October 2019: - Left ventricle: The cavity size was normal. Systolic function was    normal. The estimated ejection fraction was in the range of 60%    to 65%. Wall motion was normal; there were no regional wall    motion abnormalities.  - Aortic valve: Trileaflet; normal thickness, mildly calcified     leaflets.  - Aorta: Aortic root dimension: 44 mm (ED). Ascending aortic    diameter: 39 mm (S).  - Aortic root: The aortic root was moderately dilated.  - Ascending aorta: The ascending aorta was mildly dilated.  - Mitral valve: Calcified annulus.  - Tricuspid valve: There was trivial regurgitation.  - Pulmonic valve: There was trivial regurgitation.   EKG:  EKG is *** ordered today. The ekg ordered today demonstrates   Recent Labs: No results found for requested labs within last 8760 hours.   Lipid Panel    Component Value Date/Time   CHOL 222 (H) 06/12/2020 0819   TRIG 133 06/12/2020 0819   HDL 48 06/12/2020 0819   CHOLHDL 4.6 06/12/2020 0819   CHOLHDL 4 04/20/2018 1026   VLDL 28.8 04/20/2018 1026   LDLCALC 150 (H) 06/12/2020 0819     Wt Readings from Last 3 Encounters:  11/28/19 185 lb (83.9 kg)  05/18/18 187 lb 6.4 oz (85 kg)  04/20/18 186 lb (84.4 kg)     Other studies Reviewed: Additional studies/ records that were reviewed today include: . Review of the above records demonstrates:    Assessment and Plan:   1. CAD without angina: He was found to have coronary artery calcification on non cardiac CT in 2019. He has had no chest pain. No ischemia on nuclear stress test in November 2019. LV function normal. He is very active and exercises every day. He is a runner. Will continue ASA and statin. Goal LDL under 70. Last LDL ***.     2. Thoracic aortic aneurysm: Subtle ectasia of the aortic root at 4.0 cm by chest CTA in November 2019 and stable at 4.0 cm in November 2021. Repeat chest CTA November 2023.     Current medicines are reviewed at length with the patient today.  The patient does not have concerns regarding medicines.  The following changes have been made:  no change  Labs/ tests ordered today include:   No orders of the defined types were placed in this encounter.    Disposition:   FU with me in 12 months   Signed, Lauree Chandler,  MD 06/27/2021 5:25 PM    [facility] [place], [place], [place]  [address] Phone: [phone]; Fax: [phone]

## 2021-06-28 ENCOUNTER — Ambulatory Visit: Payer: Medicare Other | Admitting: Cardiovascular Disease

## 2021-08-12 ENCOUNTER — Other Ambulatory Visit: Payer: Self-pay

## 2021-08-12 ENCOUNTER — Telehealth: Payer: Self-pay | Admitting: Cardiovascular Disease

## 2021-08-12 MED ORDER — ROSUVASTATIN CALCIUM 5 MG PO TABS: 5.0000 mg | ORAL_TABLET | ORAL | 0 refills | Status: DC

## 2021-08-12 NOTE — Telephone Encounter (Signed)
°*  STAT* If patient is at the pharmacy, call can be transferred to refill team.   1. Which medications need to be refilled? (please list name of each medication and dose if known) rosuvastatin (CRESTOR) 5 MG tablet  2. Which pharmacy/location (including street and city if local pharmacy) is medication to be sent to?[name] - [place], [place] - [address]  3. Do they need a 30 day or 90 day supply?  Needs enough medication to last him until appt    Patient is completely out of medication and is leaving for out of town today.

## 2021-08-24 ENCOUNTER — Encounter: Payer: Self-pay | Admitting: Cardiovascular Disease

## 2021-08-24 ENCOUNTER — Ambulatory Visit (INDEPENDENT_AMBULATORY_CARE_PROVIDER_SITE_OTHER): Payer: Medicare Other

## 2021-08-24 ENCOUNTER — Other Ambulatory Visit: Payer: Self-pay

## 2021-08-24 ENCOUNTER — Ambulatory Visit (INDEPENDENT_AMBULATORY_CARE_PROVIDER_SITE_OTHER): Payer: Medicare Other | Admitting: Cardiovascular Disease

## 2021-08-24 VITALS — BP 142/84 | HR 51 | Ht 67.0 in | Wt 189.0 lb

## 2021-08-24 DIAGNOSIS — R002 Palpitations: Secondary | ICD-10-CM | POA: Diagnosis not present

## 2021-08-24 DIAGNOSIS — I2510 Atherosclerotic heart disease of native coronary artery without angina pectoris: Secondary | ICD-10-CM | POA: Diagnosis not present

## 2021-08-24 DIAGNOSIS — I712 Thoracic aortic aneurysm, without rupture, unspecified: Secondary | ICD-10-CM

## 2021-08-24 DIAGNOSIS — R0789 Other chest pain: Secondary | ICD-10-CM

## 2021-08-24 DIAGNOSIS — R0609 Other forms of dyspnea: Secondary | ICD-10-CM | POA: Diagnosis not present

## 2021-08-24 DIAGNOSIS — R072 Precordial pain: Secondary | ICD-10-CM

## 2021-08-24 LAB — BASIC METABOLIC PANEL
BUN/Creatinine Ratio: 15 (ref 10–24)
BUN: 17 mg/dL (ref 8–27)
CO2: 27 mmol/L (ref 20–29)
Calcium: 9.7 mg/dL (ref 8.6–10.2)
Chloride: 103 mmol/L (ref 96–106)
Creatinine, Ser: 1.15 mg/dL (ref 0.76–1.27)
Glucose: 120 mg/dL — ABNORMAL HIGH (ref 70–99)
Potassium: 5.7 mmol/L — ABNORMAL HIGH (ref 3.5–5.2)
Sodium: 142 mmol/L (ref 134–144)
eGFR: 70 mL/min/{1.73_m2} (ref >59)

## 2021-08-24 NOTE — Progress Notes (Unsigned)
Enrolled for [employer] to mail a ZIO XT long term holter monitor to the patients address on file.  

## 2021-08-24 NOTE — Patient Instructions (Addendum)
Medication Instructions:  Your physician recommends that you continue on your current medications as directed. Please refer to the Current Medication list given to you today.  *If you need a refill on your cardiac medications before your next appointment, please call your pharmacy*   Lab Work: Bmp- Today   If you have labs (blood work) drawn today and your tests are completely normal, you will receive your results only by: [place] (if you have MyChart) OR A paper copy in the mail If you have any lab test that is abnormal or we need to change your treatment, we will call you to review the results.   Testing/Procedures: A zio monitor was ordered today. It will remain on for 14 days. You will then return monitor and event diary in provided box. It takes 1-2 weeks for report to be downloaded and returned to [place]. We will call you with the results. If monitor falls off or has orange flashing light, please call [employer] for further instructions.    Your physician has ordered for you to get a coronary CT * Please refer to instructions given*   Follow-Up: At [facility], you and your health needs are our priority.  As part of our continuing mission to provide you with exceptional heart care, we have created designated Provider Care Teams.  These Care Teams include your primary Cardiologist (physician) and Advanced Practice Providers (APPs -  Physician Assistants and Nurse Practitioners) who all work together to provide you with the care you need, when you need it.  We recommend signing up for the patient portal called "MyChart".  Sign up information is provided on this After Visit Summary.  MyChart is used to connect with patients for Virtual Visits (Telemedicine).  Patients are able to view lab/test results, encounter notes, upcoming appointments, etc.  Non-urgent messages can be sent to your provider as well.   To learn more about what you can do with MyChart, go to [email or web address].     Your next appointment:   12 month(s)  The format for your next appointment:   In Person  Provider:   Lauree Chandler, MD     Other Instructions   Your cardiac CT will be scheduled at one of the below locations:   [facility] [address] [place], [place] [address] [phone]   If scheduled at [facility], please arrive at the [facility] main entrance (entrance A) of [facility] 30 minutes prior to test start time. You can use the FREE valet parking offered at the main entrance (encouraged to control the heart rate for the test) Proceed to the [facility] Radiology Department (first floor) to check-in and test prep.   Please follow these instructions carefully (unless otherwise directed):  Hold all erectile dysfunction medications at least 3 days (72 hrs) prior to test.  On the Night Before the Test: Be sure to Drink plenty of water. Do not consume any caffeinated/decaffeinated beverages or chocolate 12 hours prior to your test. Do not take any antihistamines 12 hours prior to your test.   On the Day of the Test: Drink plenty of water until 1 hour prior to the test. Do not eat any food 4 hours prior to the test. You may take your regular medications prior to the test.       After the Test: Drink plenty of water. After receiving IV contrast, you may experience a mild flushed feeling. This is normal. On occasion, you may experience  a mild rash up to 24 hours after the test. This is not dangerous. If this occurs, you can take Benadryl 25 mg and increase your fluid intake. If you experience trouble breathing, this can be serious. If it is severe call 911 IMMEDIATELY. If it is mild, please call our office.  We will call to schedule your test 2-4 weeks out understanding that some insurance companies will need an authorization prior to the service being performed.   For non-scheduling related questions, please contact the cardiac  imaging nurse navigator should you have any questions/concerns: Marchia Bond, Cardiac Imaging Nurse Navigator Gordy Clement, Cardiac Imaging Nurse Navigator [facility] Direct Office Dial: [phone]   For scheduling needs, including cancellations and rescheduling, please call [place], [phone].   ZIO XT- Long Term Monitor Instructions  Your physician has requested you wear a ZIO patch monitor for 14 days.  This is a single patch monitor. [employer] supplies one patch monitor per enrollment. Additional stickers are not available. Please do not apply patch if you will be having a Nuclear Stress Test,  Echocardiogram, Cardiac CT, MRI, or Chest Xray during the period you would be wearing the  monitor. The patch cannot be worn during these tests. You cannot remove and re-apply the  ZIO XT patch monitor.  Your ZIO patch monitor will be mailed 3 day USPS to your address on file. It may take 3-5 days  to receive your monitor after you have been enrolled.  Once you have received your monitor, please review the enclosed instructions. Your monitor  has already been registered assigning a specific monitor serial # to you.  Billing and Patient Assistance Program Information  We have supplied [employer] with any of your insurance information on file for billing purposes. [employer] offers a sliding scale Patient Assistance Program for patients that do not have  insurance, or whose insurance does not completely cover the cost of the ZIO monitor.  You must apply for the Patient Assistance Program to qualify for this discounted rate.  To apply, please call [employer] at [phone], select option 4, select option 2, ask to apply for  Patient Assistance Program. Theodore Demark will ask your household income, and how many people  are in your household. They will quote your out-of-pocket cost based on that information.  [employer] will also be able to set up a 14-month, interest-free  payment plan if needed.  Applying the monitor   Shave hair from upper left chest.  Hold abrader disc by orange tab. Rub abrader in 40 strokes over the upper left chest as  indicated in your monitor instructions.  Clean area with 4 enclosed alcohol pads. Let dry.  Apply patch as indicated in monitor instructions. Patch will be placed under collarbone on left  side of chest with arrow pointing upward.  Rub patch adhesive wings for 2 minutes. Remove white label marked "1". Remove the white  label marked "2". Rub patch adhesive wings for 2 additional minutes.  While looking in a mirror, press and release button in center of patch. A small green light will  flash 3-4 times. This will be your only indicator that the monitor has been turned on.  Do not shower for the first 24 hours. You may shower after the first 24 hours.  Press the button if you feel a symptom. You will hear a small click. Record Date, Time and  Symptom in the Patient Logbook.  When you are ready to remove the patch, follow instructions  on the last 2 pages of Patient  Logbook. Stick patch monitor onto the last page of Patient Logbook.  Place Patient Logbook in the blue and white box. Use locking tab on box and tape box closed  securely. The blue and white box has prepaid postage on it. Please place it in the mailbox as  soon as possible. Your physician should have your test results approximately 7 days after the  monitor has been mailed back to [facility].  Call [place] at [phone] if you have questions regarding  your ZIO XT patch monitor. Call them immediately if you see an orange light blinking on your  monitor.  If your monitor falls off in less than 4 days, contact our Monitor department at [phone].  If your monitor becomes loose or falls off after 4 days call [employer] at [phone] for  suggestions on securing your monitor

## 2021-08-24 NOTE — Progress Notes (Signed)
Chief Complaint  Patient presents with   Follow-up    CAD   History of Present Illness: 66 yo male with history of hyperlipidemia, chronic diarrhea, adenomatous colon polyp and nephrolithiasis who is here today for cardiac follow up. I saw him as a new patient October 2019 for the evaluation of an abnormal chest CT which showed coronary artery calcification. He had a chest CT for f/u of pulmonary nodules and coronary calcification was noted. He had a negative exercise stress test in 2017. He has been active and is a runner. He had rare chest pains on the right side. Never with exertion. He has dyspnea when climbing stairs. Echo October 2019 with LVEF=60-65% with normal wall motion. The aortic root was dilated. Nuclear stress test November 2019 with no ischemia. Chest CTA November 2019 with mild ectasia of the aortic root at 4.0 cm.   He is here today for follow up. The patient denies any chest pain, lower extremity edema, orthopnea, PND, dizziness, near syncope or syncope. He is having dyspnea on exertion. He is feeling his heart race very morning. He is not sure what his heart rate is at that time.    Primary Care Physician: Luetta Nutting, DO  Past Medical History:  Diagnosis Date   Chronic diarrhea    Hypercholesterolemia    Loss of hearing 2016   Renal disease    Tubular adenoma of colon     Past Surgical History:  Procedure Laterality Date   COLONOSCOPY WITH PROPOFOL N/A 11/02/2015   Procedure: COLONOSCOPY WITH PROPOFOL;  Surgeon: Garlan Fair, MD;  Location: [facility];  Service: Endoscopy;  Laterality: N/A;   KIDNEY STONE SURGERY     NASAL SEPTUM SURGERY      Current Outpatient Medications  Medication Sig Dispense Refill   aspirin EC 81 MG tablet Take 1 tablet (81 mg total) by mouth daily.     rosuvastatin (CRESTOR) 5 MG tablet Take 1 tablet (5 mg total) by mouth every Monday, Wednesday, and Friday. Pt needs to keep appt.with Cardiologist in order to receive future  refills. Thank you. 30 tablet 0   No current facility-administered medications for this visit.    No Known Allergies  Social History   Socioeconomic History   Marital status: Married    Spouse name: Not on file   Number of children: 1   Years of education: Not on file   Highest education level: Not on file  Occupational History   Occupation: Psychiatric nurse  Tobacco Use   Smoking status: Never   Smokeless tobacco: Never  Vaping Use   Vaping Use: Never used  Substance and Sexual Activity   Alcohol use: Yes    Comment: occasional wine   Drug use: No   Sexual activity: Not on file  Other Topics Concern   Not on file  Social History Narrative   Not on file   Social Determinants of Health   Financial Resource Strain: Not on file  Food Insecurity: Not on file  Transportation Needs: Not on file  Physical Activity: Not on file  Stress: Not on file  Social Connections: Not on file  Intimate Partner Violence: Not on file    Family History  Problem Relation Age of Onset   Scleroderma Mother    Parkinson's disease Father    CAD Paternal Uncle     Review of Systems:  As stated in the HPI and otherwise negative.   BP (!) 142/84    Pulse Marland Kitchen)  51    Ht 5\' 7"  (1.702 m)    Wt 189 lb (85.7 kg)    SpO2 98%    BMI 29.60 kg/m   Physical Examination: General: Well developed, well nourished, NAD  HEENT: OP clear, mucus membranes moist  SKIN: warm, dry. No rashes. Neuro: No focal deficits  Musculoskeletal: Muscle strength 5/5 all ext  Psychiatric: Mood and affect normal  Neck: No JVD, no carotid bruits, no thyromegaly, no lymphadenopathy.  Lungs:Clear bilaterally, no wheezes, rhonci, crackles Cardiovascular: Regular rate and rhythm. No murmurs, gallops or rubs. Abdomen:Soft. Bowel sounds present. Non-tender.  Extremities: No lower extremity edema. Pulses are 2 + in the bilateral DP/PT.  Echo October 2019: - Left ventricle: The cavity size was normal. Systolic function  was    normal. The estimated ejection fraction was in the range of 60%    to 65%. Wall motion was normal; there were no regional wall    motion abnormalities.  - Aortic valve: Trileaflet; normal thickness, mildly calcified    leaflets.  - Aorta: Aortic root dimension: 44 mm (ED). Ascending aortic    diameter: 39 mm (S).  - Aortic root: The aortic root was moderately dilated.  - Ascending aorta: The ascending aorta was mildly dilated.  - Mitral valve: Calcified annulus.  - Tricuspid valve: There was trivial regurgitation.  - Pulmonic valve: There was trivial regurgitation.   EKG:  EKG is ordered today. The ekg ordered today demonstrates sinus bradycardia, rate 51 bpm.   Recent Labs: No results found for requested labs within last 8760 hours.   Lipid Panel    Component Value Date/Time   CHOL 222 (H) 06/12/2020 0819   TRIG 133 06/12/2020 0819   HDL 48 06/12/2020 0819   CHOLHDL 4.6 06/12/2020 0819   CHOLHDL 4 04/20/2018 1026   VLDL 28.8 04/20/2018 1026   LDLCALC 150 (H) 06/12/2020 0819     Wt Readings from Last 3 Encounters:  08/24/21 189 lb (85.7 kg)  11/28/19 185 lb (83.9 kg)  05/18/18 187 lb 6.4 oz (85 kg)     Other studies Reviewed: Additional studies/ records that were reviewed today include: . Review of the above records demonstrates:    Assessment and Plan:   1. CAD without angina: He was found to have coronary artery calcification on non cardiac CT in 2019. No ischemia on nuclear stress test in November 2019. LV function normal. He has no chest pain but he has dyspnea on exertion. Will arrange a cardiac CTA to assess for obstructive CAD. Continue ASA and statin.   2. Thoracic aortic aneurysm: Subtle ectasia of the aortic root at 4.0 cm by chest CTA in November 2021. Repeat November 2023.     3. Palpitations: He is feeling his heart race 2-3 mornings per week. Will set up a 14 day cardiac monitor.   Current medicines are reviewed at length with the patient today.   The patient does not have concerns regarding medicines.  The following changes have been made:  no change  Labs/ tests ordered today include:   Orders Placed This Encounter  Procedures   CT ANGIO CHEST AORTA W/ & OR WO/CM & GATING ([place] ONLY)   Basic metabolic panel   LONG TERM MONITOR (3-14 DAYS)   EKG 12-Lead     Disposition:   F/U with me in 12 months   Signed, Lauree Chandler, MD 08/24/2021 9:27 AM    [facility] [place], [place]  [address] [place], [place]  [address] Phone: [phone]; Fax: [phone]

## 2021-08-25 ENCOUNTER — Telehealth: Payer: Self-pay | Admitting: Cardiovascular Disease

## 2021-08-25 ENCOUNTER — Encounter: Payer: Self-pay | Admitting: Cardiovascular Disease

## 2021-08-25 ENCOUNTER — Other Ambulatory Visit: Payer: Medicare Other | Admitting: *Deleted

## 2021-08-25 DIAGNOSIS — E875 Hyperkalemia: Secondary | ICD-10-CM

## 2021-08-25 LAB — BASIC METABOLIC PANEL
BUN/Creatinine Ratio: 11 (ref 10–24)
BUN: 12 mg/dL (ref 8–27)
CO2: 29 mmol/L (ref 20–29)
Calcium: 9.5 mg/dL (ref 8.6–10.2)
Chloride: 103 mmol/L (ref 96–106)
Creatinine, Ser: 1.1 mg/dL (ref 0.76–1.27)
Glucose: 107 mg/dL — ABNORMAL HIGH (ref 70–99)
Potassium: 4.6 mmol/L (ref 3.5–5.2)
Sodium: 139 mmol/L (ref 134–144)
eGFR: 74 mL/min/{1.73_m2} (ref >59)

## 2021-08-25 NOTE — Telephone Encounter (Signed)
Patient is returning call to discuss lab results. 

## 2021-08-25 NOTE — Telephone Encounter (Signed)
Pt will stop by the office today for repeat BMET to follow up on  hyperkalemia, suspected possible hemolyzed.

## 2021-08-26 ENCOUNTER — Telehealth: Payer: Self-pay | Admitting: *Deleted

## 2021-08-26 NOTE — Telephone Encounter (Signed)
Patient provided telephone number of [employer] , [phone], to call directly for quote on out of pocket cost for ZIO XT monitor. [employer] had been supplied patients insurance information or monitor order. Patient aware if he has received the monitor and chooses not to proceed with wearing it, simply to return the device to [facility] in the package with prepaid postage.  Patient will not be billed for any device returned unused.

## 2021-09-01 DIAGNOSIS — R002 Palpitations: Secondary | ICD-10-CM

## 2021-09-15 ENCOUNTER — Telehealth: Payer: Self-pay | Admitting: *Deleted

## 2021-09-15 ENCOUNTER — Telehealth (HOSPITAL_COMMUNITY): Payer: Self-pay | Admitting: *Deleted

## 2021-09-15 NOTE — Telephone Encounter (Signed)
Reaching out to patient to offer assistance regarding upcoming cardiac imaging study; pt verbalizes understanding of appt date/time, parking situation and where to check in, pre-test NPO status, and verified current allergies; name and call back number provided for further questions should they arise  Douglas Clement RN Navigator Cardiac [facility] [phone] office [phone] cell  Patient aware to arrive at 9am for his 9:30am scan.

## 2021-09-15 NOTE — Telephone Encounter (Signed)
Time of cardiac CT for tomorrow changed to 9:30 from 9:00 am.  The patient has been left a VM and sent a MyChart message to notify.

## 2021-09-15 NOTE — Addendum Note (Signed)
Addended by: Mendel Ryder on: 09/15/2021 02:28 PM   Modules accepted: Orders

## 2021-09-16 ENCOUNTER — Encounter (HOSPITAL_COMMUNITY): Payer: Self-pay

## 2021-09-16 ENCOUNTER — Ambulatory Visit (HOSPITAL_COMMUNITY)
Admission: RE | Admit: 2021-09-16 | Discharge: 2021-09-16 | Disposition: A | Discharge status: Home / Self Care | Payer: Medicare Other | Source: Ambulatory Visit | Attending: Cardiovascular Disease | Admitting: Cardiovascular Disease

## 2021-09-16 ENCOUNTER — Other Ambulatory Visit: Payer: Self-pay

## 2021-09-16 ENCOUNTER — Encounter: Payer: Self-pay | Admitting: Cardiovascular Disease

## 2021-09-16 ENCOUNTER — Ambulatory Visit (HOSPITAL_COMMUNITY): Payer: Medicare Other

## 2021-09-16 DIAGNOSIS — R072 Precordial pain: Secondary | ICD-10-CM | POA: Diagnosis present

## 2021-09-16 MED ORDER — NITROGLYCERIN 0.4 MG SL SUBL
0.8000 mg | SUBLINGUAL_TABLET | Freq: Once | SUBLINGUAL | Status: AC
  Administered 2021-09-16: 0.8 mg via SUBLINGUAL

## 2021-09-16 MED ORDER — IOHEXOL 350 MG/ML SOLN
95.0000 mL | Freq: Once | INTRAVENOUS | Status: AC | PRN
  Administered 2021-09-16: 95 mL via INTRAVENOUS

## 2021-09-16 MED ORDER — NITROGLYCERIN 0.4 MG SL SUBL
SUBLINGUAL_TABLET | SUBLINGUAL | Status: AC
  Filled 2021-09-16: qty 2

## 2021-09-24 ENCOUNTER — Encounter: Payer: Self-pay | Admitting: Cardiovascular Disease

## 2021-09-24 DIAGNOSIS — I2510 Atherosclerotic heart disease of native coronary artery without angina pectoris: Secondary | ICD-10-CM

## 2021-09-27 MED ORDER — METOPROLOL SUCCINATE ER 25 MG PO TB24: 25.0000 mg | ORAL_TABLET | Freq: Every day | ORAL | 5 refills | Status: DC

## 2021-10-11 ENCOUNTER — Other Ambulatory Visit: Payer: Medicare Other

## 2021-10-12 ENCOUNTER — Other Ambulatory Visit: Payer: Self-pay

## 2021-10-12 ENCOUNTER — Other Ambulatory Visit: Payer: Medicare Other | Admitting: *Deleted

## 2021-10-12 DIAGNOSIS — I2510 Atherosclerotic heart disease of native coronary artery without angina pectoris: Secondary | ICD-10-CM

## 2021-10-12 LAB — LIPID PANEL
Chol/HDL Ratio: 3.8 ratio (ref 0.0–5.0)
Cholesterol, Total: 204 mg/dL — ABNORMAL HIGH (ref 100–199)
HDL: 53 mg/dL (ref >39)
LDL Chol Calc (NIH): 131 mg/dL — ABNORMAL HIGH (ref 0–99)
Triglycerides: 112 mg/dL (ref 0–149)
VLDL Cholesterol Cal: 20 mg/dL (ref 5–40)

## 2021-10-20 ENCOUNTER — Encounter: Payer: Self-pay | Admitting: Cardiovascular Disease

## 2021-10-21 MED ORDER — ROSUVASTATIN CALCIUM 5 MG PO TABS: 5.0000 mg | ORAL_TABLET | Freq: Every day | ORAL | 3 refills | Status: AC

## 2021-10-21 NOTE — Telephone Encounter (Signed)
Updated medication list to reflect patient is taking daily. ?

## 2021-11-05 ENCOUNTER — Encounter: Payer: Self-pay | Admitting: Internal Medicine

## 2021-12-13 ENCOUNTER — Other Ambulatory Visit (INDEPENDENT_AMBULATORY_CARE_PROVIDER_SITE_OTHER): Payer: Medicare Other

## 2021-12-13 ENCOUNTER — Encounter: Payer: Self-pay | Admitting: Internal Medicine

## 2021-12-13 ENCOUNTER — Ambulatory Visit (INDEPENDENT_AMBULATORY_CARE_PROVIDER_SITE_OTHER): Payer: Medicare Other | Admitting: Internal Medicine

## 2021-12-13 VITALS — BP 122/80 | HR 64 | Ht 68.0 in | Wt 185.0 lb

## 2021-12-13 DIAGNOSIS — E785 Hyperlipidemia, unspecified: Secondary | ICD-10-CM | POA: Diagnosis not present

## 2021-12-13 DIAGNOSIS — R198 Other specified symptoms and signs involving the digestive system and abdomen: Secondary | ICD-10-CM

## 2021-12-13 DIAGNOSIS — I2510 Atherosclerotic heart disease of native coronary artery without angina pectoris: Secondary | ICD-10-CM

## 2021-12-13 DIAGNOSIS — Z860100 Personal history of colon polyps, unspecified: Secondary | ICD-10-CM

## 2021-12-13 DIAGNOSIS — R140 Abdominal distension (gaseous): Secondary | ICD-10-CM | POA: Diagnosis not present

## 2021-12-13 DIAGNOSIS — K580 Irritable bowel syndrome with diarrhea: Secondary | ICD-10-CM

## 2021-12-13 LAB — COMPREHENSIVE METABOLIC PANEL
ALT: 30 U/L (ref 0–53)
AST: 27 U/L (ref 0–37)
Albumin: 4.6 g/dL (ref 3.5–5.2)
Alkaline Phosphatase: 44 U/L (ref 39–117)
BUN: 14 mg/dL (ref 6–23)
CO2: 30 mEq/L (ref 19–32)
Calcium: 9.6 mg/dL (ref 8.4–10.5)
Chloride: 103 mEq/L (ref 96–112)
Creatinine, Ser: 1.01 mg/dL (ref 0.40–1.50)
GFR: 77.06 mL/min (ref >60.00)
Glucose, Bld: 131 mg/dL — ABNORMAL HIGH (ref 70–99)
Potassium: 4.2 mEq/L (ref 3.5–5.1)
Sodium: 140 mEq/L (ref 135–145)
Total Bilirubin: 0.9 mg/dL (ref 0.2–1.2)
Total Protein: 7.7 g/dL (ref 6.0–8.3)

## 2021-12-13 MED ORDER — RIFAXIMIN 550 MG PO TABS: 550.0000 mg | ORAL_TABLET | Freq: Three times a day (TID) | ORAL | 0 refills | Status: AC

## 2021-12-13 NOTE — Patient Instructions (Signed)
Your provider has requested that you go to the basement level for lab work before leaving today. Press "B" on the elevator. The lab is located at the first door on the left as you exit the elevator.  We have sent the following medications to your pharmacy for you to pick up at your convenience: Xifaxan 550 mg three times daily x 14 days  Please send a mychart update on how you are doing following Xifaxan.  You will be due for a recall colonoscopy in 10/2022. We will send you a reminder in the mail when it gets closer to that time.  If you are age 3 or older, your body mass index should be between 23-30. Your Body mass index is 28.13 kg/m. If this is out of the aforementioned range listed, please consider follow up with your Primary Care Provider.  If you are age 39 or younger, your body mass index should be between 19-25. Your Body mass index is 28.13 kg/m. If this is out of the aformentioned range listed, please consider follow up with your Primary Care Provider.   ________________________________________________________  The [facility] providers would like to encourage you to use [facility] to communicate with providers for non-urgent requests or questions.  Due to long hold times on the telephone, sending your provider a message by [facility] may be a faster and more efficient way to get a response.  Please allow 48 business hours for a response.  Please remember that this is for non-urgent requests.  _______________________________________________________  Due to recent changes in healthcare laws, you may see the results of your imaging and laboratory studies on MyChart before your provider has had a chance to review them.  We understand that in some cases there may be results that are confusing or concerning to you. Not all laboratory results come back in the same time frame and the provider may be waiting for multiple results in order to interpret others.  Please give [place] 48 hours in order for  your provider to thoroughly review all the results before contacting the office for clarification of your results.

## 2021-12-13 NOTE — Progress Notes (Signed)
Patient ID: Douglas Kirby, male   DOB: 1956-01-27, 66 y.o.   MRN: [id number] HPI: Douglas Kirby is a 66 year old male with a history of chronic loose stools with abdominal bloating, nonadvanced adenoma of the colon, hypercholesterolemia who is seen to discuss ongoing issues with loose stools, abdominal bloating and borborygmi.  He is here alone today.  I saw him in August 2019 with similar GI symptoms.  At that time we did a battery of lab tests most of which were normal.  His TTG was negative.  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  Stool test for ova and parasite, H. pylori was negative.  CRP normal.  Blood counts normal.  Iron studies normal.  B12 normal.  Today he reports that he feels well though he continues to deal with loose, soft or at times watery stools.  This can affect overall function.  He is continuing to be active in his training for another ultimate run.  He has a very clean diet focused on organic meats, vegetables and fruits.  Very little processed foods.  Very little dairy.  He did admit to drinking more alcohol during the pandemic but he has cut this almost entirely out.  He is having abdominal noise and bloating.  This is worse when he lies on his left side at night.  He tried a probiotic for 3 months without benefit.  No blood in stool or melena.  No upper GI or hepatobiliary complaint.  Patient also feels that he may have leaky gut.  Past Medical History:  Diagnosis Date   Chronic diarrhea    Hypercholesterolemia    Loss of hearing 2016   Renal disease    Tubular adenoma of colon     Past Surgical History:  Procedure Laterality Date   COLONOSCOPY WITH PROPOFOL N/A 11/02/2015   Procedure: COLONOSCOPY WITH PROPOFOL;  Surgeon: Garlan Fair, MD;  Location: [facility];  Service: Endoscopy;  Laterality: N/A;   KIDNEY STONE SURGERY     NASAL SEPTUM SURGERY      Outpatient Medications Prior to Visit  Medication Sig Dispense Refill   rosuvastatin (CRESTOR) 5  MG tablet Take 1 tablet (5 mg total) by mouth daily. 90 tablet 3   aspirin EC 81 MG tablet Take 1 tablet (81 mg total) by mouth daily. (Patient not taking: Reported on 12/13/2021)     metoprolol succinate (TOPROL XL) 25 MG 24 hr tablet Take 1 tablet (25 mg total) by mouth daily. (Patient not taking: Reported on 12/13/2021) 30 tablet 5   No facility-administered medications prior to visit.    No Known Allergies  Family History  Problem Relation Age of Onset   Scleroderma Mother    Heart disease Mother    Parkinson's disease Father    CAD Paternal Uncle    Colon cancer Neg Hx    Stomach cancer Neg Hx    Esophageal cancer Neg Hx     Social History   Tobacco Use   Smoking status: Never   Smokeless tobacco: Never  Vaping Use   Vaping Use: Never used  Substance Use Topics   Alcohol use: Yes    Comment: occasional wine   Drug use: No    ROS: As per history of present illness, otherwise negative  BP 122/80   Pulse 64   Ht $R'5\' 8"'tL$  (1.727 m)   Wt 185 lb (83.9 kg)   SpO2 95%   BMI 28.13 kg/m  Gen: awake, alert, NAD HEENT: anicteric  CV: RRR, no mrg Pulm: CTA b/l Abd: soft, NT/ND, +BS throughout Ext: no c/c/e Neuro: nonfocal   RELEVANT LABS AND IMAGING: CBC    Component Value Date/Time   WBC 5.2 03/20/2018 1059   RBC 4.99 03/20/2018 1059   HGB 15.7 03/20/2018 1059   HCT 45.8 03/20/2018 1059   PLT 255.0 03/20/2018 1059   MCV 91.7 03/20/2018 1059   MCHC 34.2 03/20/2018 1059   RDW 13.5 03/20/2018 1059   LYMPHSABS 2.3 03/20/2018 1059   MONOABS 0.3 03/20/2018 1059   EOSABS 0.1 03/20/2018 1059   BASOSABS 0.0 03/20/2018 1059    CMP     Component Value Date/Time   NA 139 08/25/2021 0000   K 4.6 08/25/2021 0000   CL 103 08/25/2021 0000   CO2 29 08/25/2021 0000   GLUCOSE 107 (H) 08/25/2021 0000   GLUCOSE 113 (H) 03/20/2018 1059   BUN 12 08/25/2021 0000   CREATININE 1.10 08/25/2021 0000   CALCIUM 9.5 08/25/2021 0000   PROT 7.1 06/12/2020 0819   ALBUMIN 4.4  06/12/2020 0819   AST 21 06/12/2020 0819   ALT 23 06/12/2020 0819   ALKPHOS 54 06/12/2020 0819   BILITOT 0.4 06/12/2020 0819   GFRNONAA 80 06/12/2020 0819   GFRAA 92 06/12/2020 0819    ASSESSMENT/PLAN: 66 year old male with a history of chronic loose stools with abdominal bloating, nonadvanced adenoma of the colon, hypercholesterolemia who is seen to discuss ongoing issues with loose stools, abdominal bloating and borborygmi.   Abdominal bloating/loose stools/borborygmi --very similar symptoms as to 2019.  We discussed this today.  I am suspicious for IBS-D but cannot exclude SIBO.  Celiac disease negative by TTG 2019 though he did have HLA typing which would make celiac disease potentially possible.  I still think this is less likely.  We discussed the gut microbiome and how it is important in not only our immune system but also gut function and nutrition. --TTG, IgA, CRP and CMP --Fecal elastase --Rifaximin 550 mg 3 times daily x14 days for IBS-D --If not better we could consider cross-sectional imaging with CT or even repeat colonoscopy and upper endoscopy --I asked that he notify me by MyChart or phone call about 2 weeks after rifaximin therapy on his response  2.  History of small adenoma of the colon --surveillance recommended April 2024    [id number], Lajuan [address], [place] [place],  [place] [address]

## 2021-12-14 ENCOUNTER — Other Ambulatory Visit (HOSPITAL_COMMUNITY): Payer: Self-pay

## 2021-12-14 ENCOUNTER — Encounter: Payer: Self-pay | Admitting: Internal Medicine

## 2021-12-14 ENCOUNTER — Telehealth: Payer: Self-pay | Admitting: Pharmacy Technician

## 2021-12-14 LAB — IGA: Immunoglobulin A: 329 mg/dL — ABNORMAL HIGH (ref 70–320)

## 2021-12-14 LAB — TISSUE TRANSGLUTAMINASE, IGA: (tTG) Ab, IgA: <1 U/mL

## 2021-12-14 NOTE — Telephone Encounter (Signed)
Patient Advocate Encounter  Received notification from [employer] that prior authorization for XIFAXAN '550MG'$  is required.   PA submitted on 5.23.23 Key [id number] Status is pending   [facility] will continue to follow  Luciano Cutter, CPhT Patient Advocate Phone: [phone]

## 2021-12-16 ENCOUNTER — Other Ambulatory Visit (HOSPITAL_COMMUNITY): Payer: Self-pay

## 2021-12-16 LAB — HIGH SENSITIVITY CRP: CRP, High Sensitivity: 2.28 mg/L (ref 0.000–5.000)

## 2021-12-16 NOTE — Telephone Encounter (Signed)
Samples given to [facility]

## 2021-12-16 NOTE — Telephone Encounter (Signed)
Received notification from [facility] regarding a prior authorization for XIFAXAN '550MG'$ . Authorization has been APPROVED from 1.1.23 to 6.6.23.   Per test claim, copay for 14 days supply is $976.01   Authorization # [id number]     [id number]

## 2021-12-16 NOTE — Telephone Encounter (Signed)
Dr Pyrtle-please advise.... 

## 2021-12-16 NOTE — Telephone Encounter (Signed)
Would provide samples from sales reps. Thanks [employer]

## 2021-12-16 NOTE — Telephone Encounter (Signed)
Contacted patient to advise that we have placed 42 tablets of xifaxan at the front desk for him to pick up. He verbalizes understanding.

## 2022-01-03 ENCOUNTER — Other Ambulatory Visit: Payer: Medicare Other

## 2022-01-03 DIAGNOSIS — K580 Irritable bowel syndrome with diarrhea: Secondary | ICD-10-CM

## 2022-01-10 LAB — PANCREATIC ELASTASE, FECAL: Pancreatic Elastase-1, Stool: 469 mcg/g

## 2022-01-14 ENCOUNTER — Encounter: Payer: Self-pay | Admitting: Internal Medicine

## 2022-04-22 ENCOUNTER — Encounter: Payer: Self-pay | Admitting: Cardiovascular Disease

## 2022-05-03 ENCOUNTER — Telehealth: Payer: Self-pay | Admitting: Internal Medicine

## 2022-05-03 NOTE — Telephone Encounter (Signed)
Called patient to schedule . Patient states he does not need to see a provider at this time.

## 2022-08-17 IMAGING — CT CT ANGIO CHEST
2 series · 19 of 32 positions shown · IV contrast (APPLIED)
Comparison: 06/15/2018

CLINICAL DATA: Follow-up thoracic aneurysm

EXAM:
CT ANGIOGRAPHY CHEST WITH CONTRAST
TECHNIQUE: Multidetector CT imaging of the chest was performed using the
standard protocol during bolus administration of intravenous
contrast. Multiplanar CT image reconstructions and MIPs were
obtained to evaluate the vascular anatomy.
CONTRAST:  75mL WBHSPZ-T1I IOPAMIDOL (WBHSPZ-T1I) INJECTION 76%

[Series 4: chest angio · axial · 0.83mm/px · z∈[-262,+6]mm · 12 of 107 slices shown]
[im 9/107  lung]
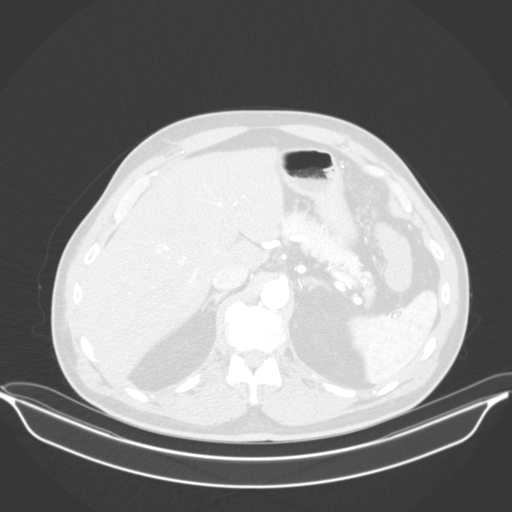
[im 17/107  soft-tissue]
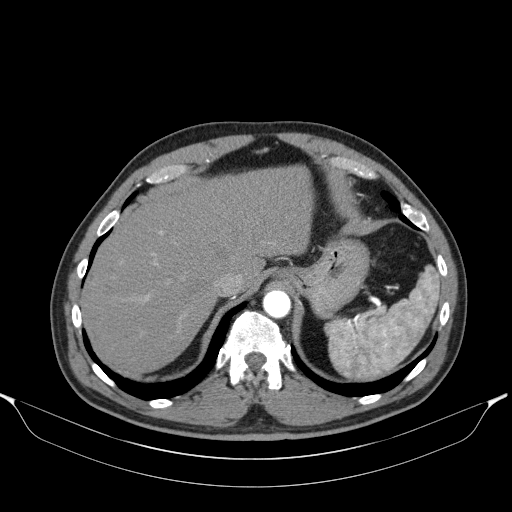
[im 25/107  lung]
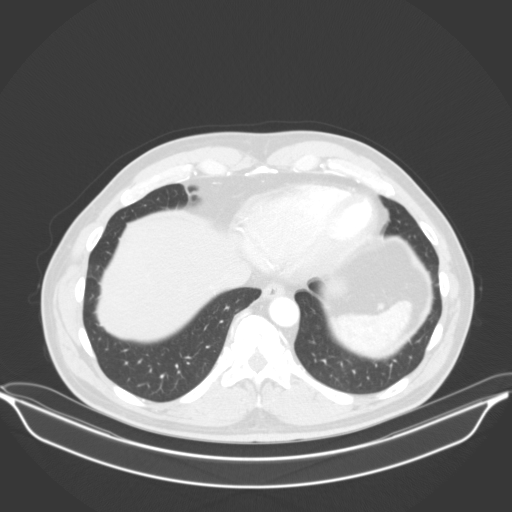
[im 33/107  soft-tissue]
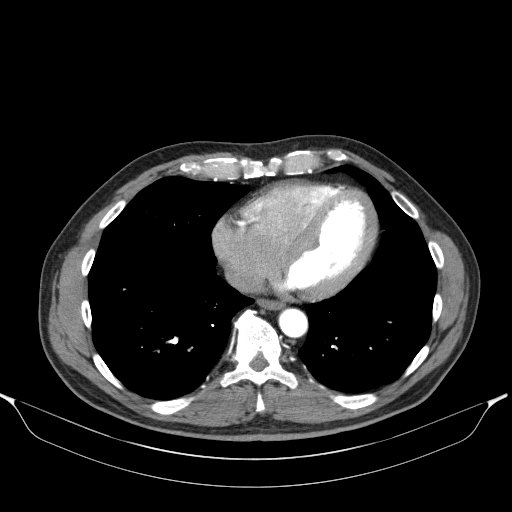
[im 41/107  lung]
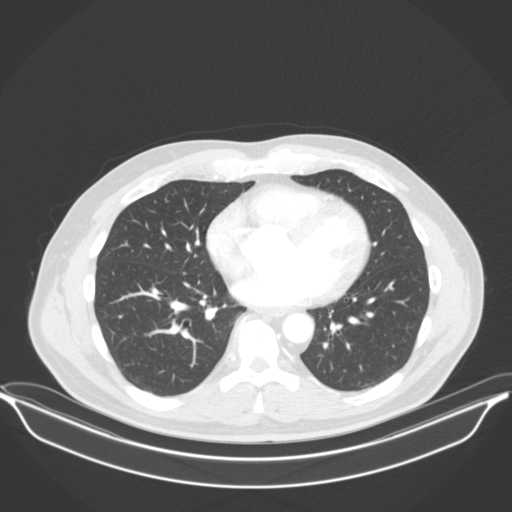
[im 49/107  soft-tissue]
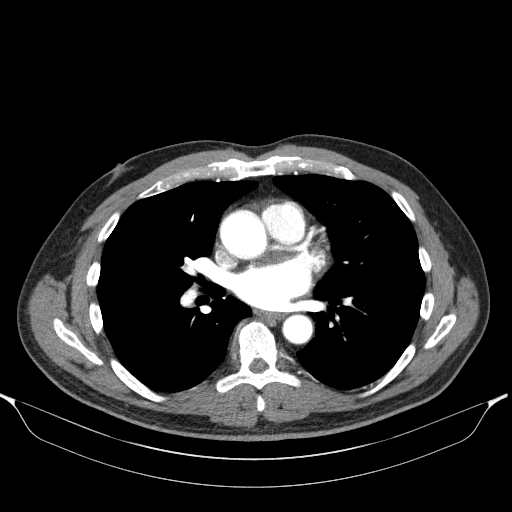
[im 58/107  lung]
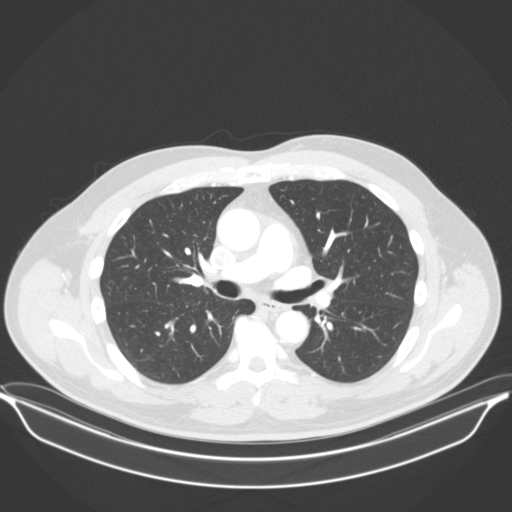
[im 66/107  soft-tissue]
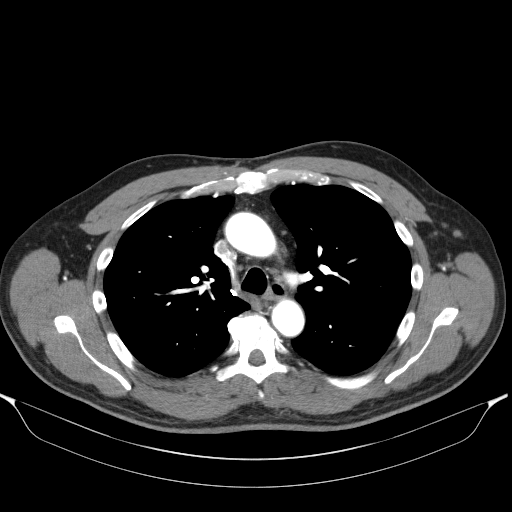
[im 74/107  lung]
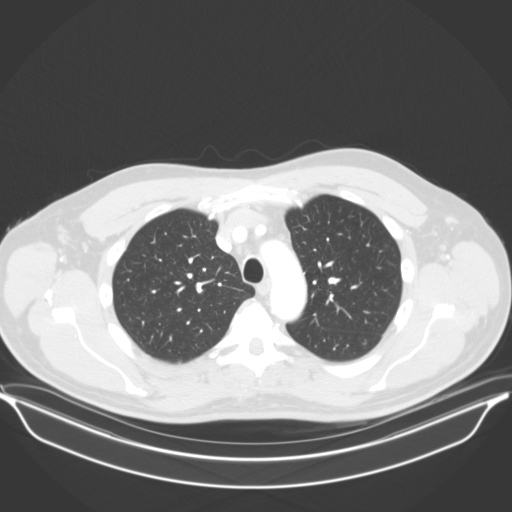
[im 82/107  soft-tissue]
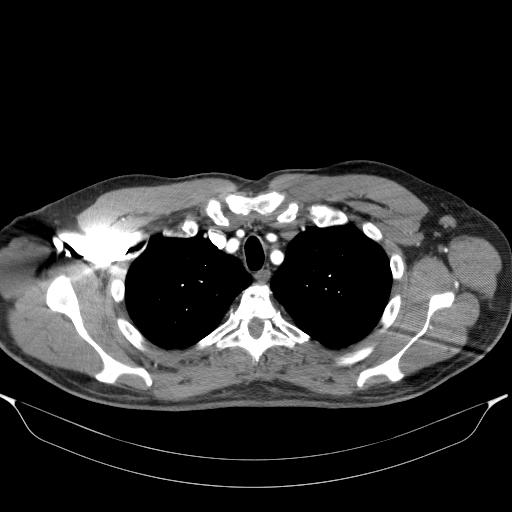
[im 90/107  lung]
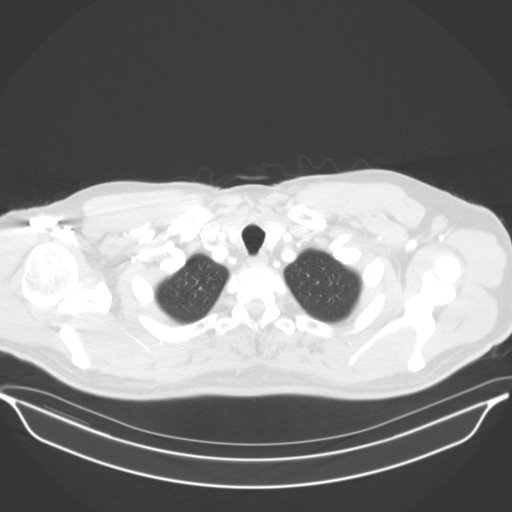
[im 98/107  soft-tissue]
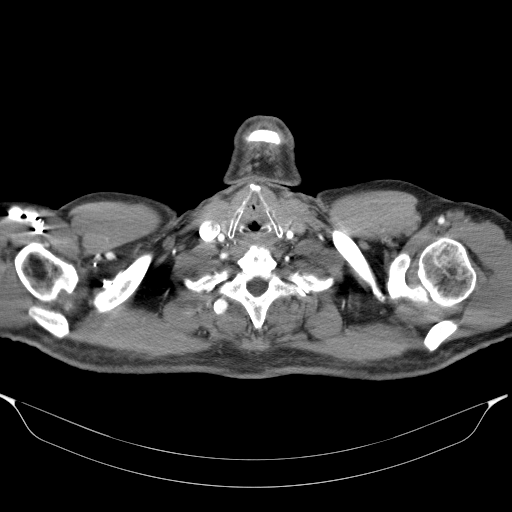

[Series 11: lung · axial · 0.83mm/px · z∈[-222,-20]mm · 7 of 144 slices shown]
[im 17/144  soft-tissue]
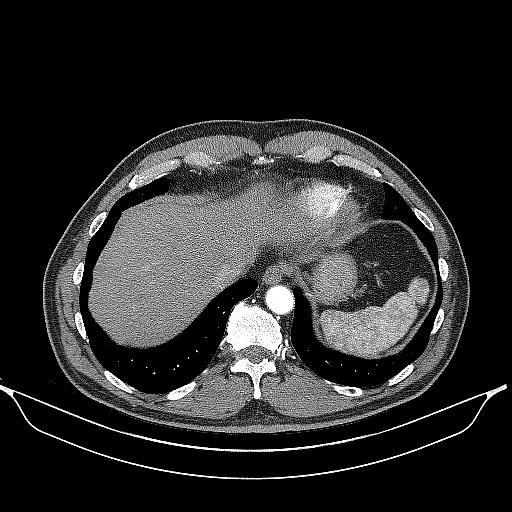
[im 34/144  soft-tissue]
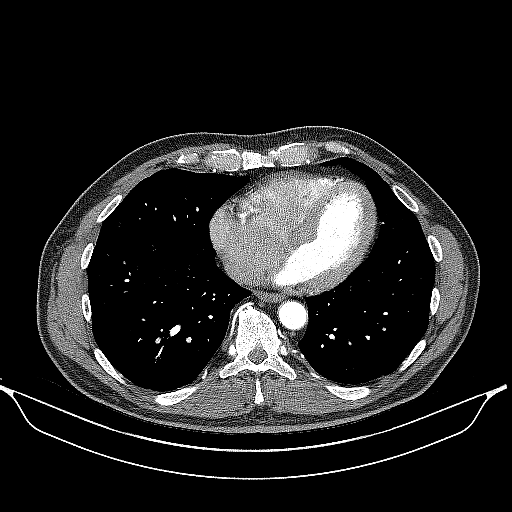
[im 51/144  soft-tissue]
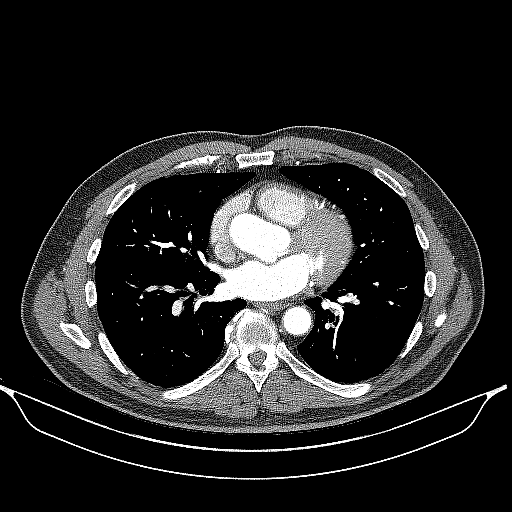
[im 68/144  soft-tissue]
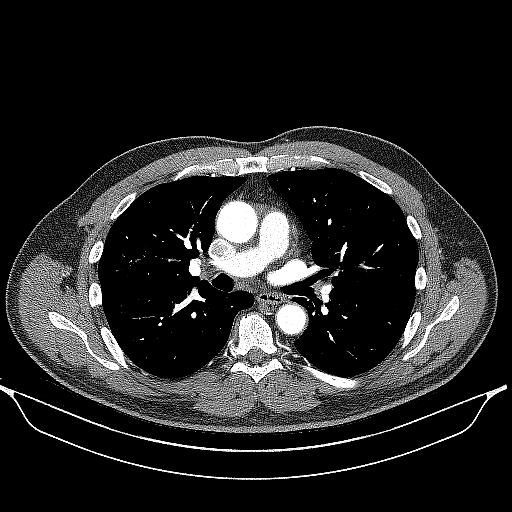
[im 85/144  soft-tissue]
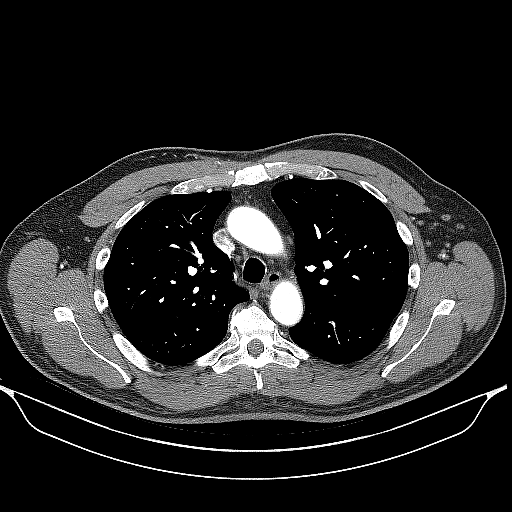
[im 101/144  soft-tissue]
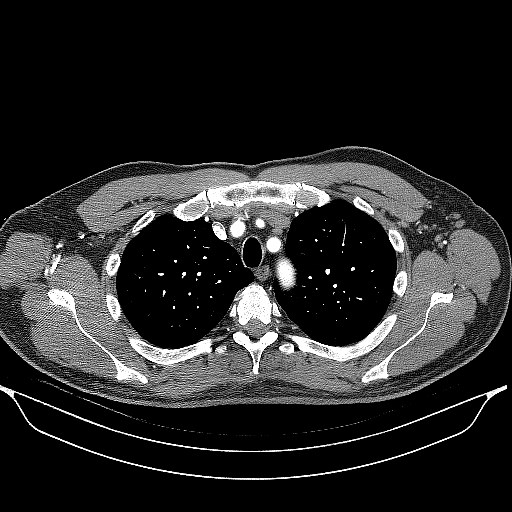
[im 118/144  soft-tissue]
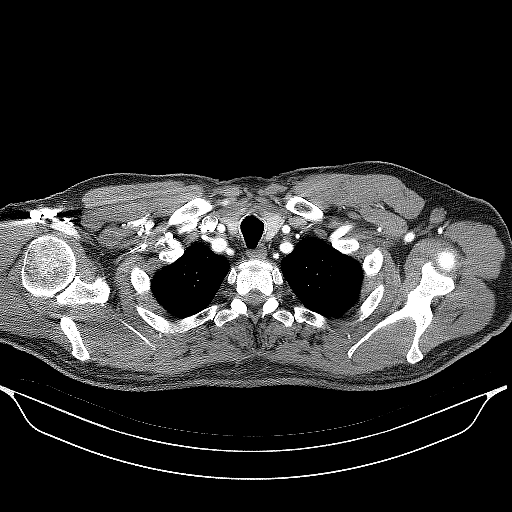

[19 of 32 positions shown; findings below may reference images not displayed]

FINDINGS: Cardiovascular: Scattered atherosclerotic calcifications of the
thoracic aorta are noted. Dilatation at the level of the sinus of
Valsalva is noted to 4 cm. The ascending aorta is mildly dilated at
3.5 cm relatively stable from the prior exam. No dissection is
noted. Brachiocephalic vessels are within normal limits. Descending
thoracic aorta is unremarkable. No significant cardiac enlargement
is noted. Coronary arteries are within normal limits. The visualized
pulmonary artery is within normal limits. Left atrial appendage
remains free of thrombus.

Mediastinum/Nodes: Thoracic inlet is unremarkable. No hilar or
mediastinal adenopathy is noted. The esophagus as visualized is
within normal limits.

Lungs/Pleura: Lungs are well aerated bilaterally. Stable thickening
along the major fissure on the left is noted superiorly. Stable
subpleural nodule in the left lower lobe on image number 91 of
series 11 is noted. Given its long-term stability it is felt to be
benign in etiology. No new nodules are seen. No focal infiltrate is
noted.

Upper Abdomen: Visualized upper abdomen shows fatty infiltration of
the liver. No other focal abnormality is noted.

Musculoskeletal: Degenerative changes of the thoracic spine are
seen. No acute rib abnormality is noted.

Review of the MIP images confirms the above findings.
IMPRESSION: Stable dilatation of the aortic root to 4 cm.

Stable subpleural nodules consistent with a benign etiology.

No acute abnormality is noted.

Aortic Atherosclerosis ([id number]).

## 2022-12-14 ENCOUNTER — Encounter: Payer: Self-pay | Admitting: Internal Medicine

## 2023-03-09 ENCOUNTER — Ambulatory Visit (AMBULATORY_SURGERY_CENTER): Payer: Medicare Other | Admitting: *Deleted

## 2023-03-09 ENCOUNTER — Encounter: Payer: Self-pay | Admitting: Cardiovascular Disease

## 2023-03-09 VITALS — Ht 68.0 in | Wt 173.0 lb

## 2023-03-09 DIAGNOSIS — Z86010 Personal history of colonic polyps: Secondary | ICD-10-CM

## 2023-03-09 MED ORDER — PEG 3350-KCL-NA BICARB-NACL 420 G PO SOLR: 4000.0000 mL | Freq: Once | ORAL | 0 refills | Status: AC

## 2023-03-09 NOTE — Progress Notes (Signed)
Pt's name and DOB verified at the beginning of the pre-visit.  Pt denies any difficulty with ambulating,sitting, laying down or rolling side to side Gave both [facility] main # and MD on call # prior to instructions.  No egg or soy allergy known to patient  No issues known to pt with past sedation with any surgeries or procedures Pt denies having issues being intubated Pt has no issues moving head neck or swallowing No FH of Malignant Hyperthermia Pt is not on diet pills Pt is not on home 02  Pt is not on blood thinners  Pt denies issues with constipation  Pt is not on dialysis Pt denise any abnormal heart rhythms  Pt denies any upcoming cardiac testing Pt encouraged to use to use Singlecare or Goodrx to reduce cost  Patient's chart reviewed by Cathlyn Parsons CNRA prior to pre-visit and patient appropriate for the [facility].  Pre-visit completed and red dot placed by patient's name on their procedure day (on provider's schedule).  . Visit by phone Pt states weight is 173 lb Instructed pt why it is important to and  to call if they have any changes in health or new medications. Directed them to the # given and on instructions.   Pt states they will.  Instructions reviewed with pt and pt states understanding. Instructed to review again prior to procedure. Pt states they will.  Instructions sent by mail with coupon and by my chart

## 2023-04-04 ENCOUNTER — Encounter: Payer: Self-pay | Admitting: Internal Medicine

## 2023-04-04 ENCOUNTER — Encounter: Payer: Self-pay | Admitting: Cardiovascular Disease

## 2023-04-04 ENCOUNTER — Ambulatory Visit (AMBULATORY_SURGERY_CENTER): Payer: Medicare Other | Admitting: Internal Medicine

## 2023-04-04 VITALS — BP 108/72 | HR 59 | Temp 98.2°F | Resp 10 | Ht 68.0 in | Wt 173.0 lb

## 2023-04-04 DIAGNOSIS — Z86010 Personal history of colonic polyps: Secondary | ICD-10-CM

## 2023-04-04 DIAGNOSIS — Z09 Encounter for follow-up examination after completed treatment for conditions other than malignant neoplasm: Secondary | ICD-10-CM | POA: Diagnosis not present

## 2023-04-04 MED ORDER — SODIUM CHLORIDE 0.9 % IV SOLN: 500.0000 mL | Freq: Once | INTRAVENOUS | Status: DC

## 2023-04-04 NOTE — Progress Notes (Signed)
Pt's states no medical or surgical changes since previsit or office visit. 

## 2023-04-04 NOTE — Progress Notes (Signed)
To pacu, VSS. Report to [email or web address].tb 

## 2023-04-04 NOTE — Op Note (Signed)
[facility] Patient Name: Douglas Kirby Procedure Date: 04/04/2023 8:37 AM MRN: [id number] Endoscopist: Beverley Fiedler , MD, [id number] Age: 67 Referring MD:  Date of Birth: [birth year] Gender: Male Account #: [id number] Procedure:                Colonoscopy Indications:              High risk colon cancer surveillance: Personal                            history of non-advanced adenoma Medicines:                Monitored Anesthesia Care Procedure:                Pre-Anesthesia Assessment:                           - Prior to the procedure, a History and Physical                            was performed, and patient medications and                            allergies were reviewed. The patient's tolerance of                            previous anesthesia was also reviewed. The risks                            and benefits of the procedure and the sedation                            options and risks were discussed with the patient.                            All questions were answered, and informed consent                            was obtained. Prior Anticoagulants: The patient has                            taken no anticoagulant or antiplatelet agents. ASA                            Grade Assessment: II - A patient with mild systemic                            disease. After reviewing the risks and benefits,                            the patient was deemed in satisfactory condition to                            undergo the procedure.  After obtaining informed consent, the colonoscope                            was passed under direct vision. Throughout the                            procedure, the patient's blood pressure, pulse, and                            oxygen saturations were monitored continuously. The                            Olympus Scope [id number] was introduced through the                            anus and advanced to the  terminal ileum. The                            colonoscopy was performed without difficulty. The                            patient tolerated the procedure well. The quality                            of the bowel preparation was excellent. The                            terminal ileum, ileocecal valve, appendiceal                            orifice, and rectum were photographed. Scope In: 8:43:10 AM Scope Out: 8:56:53 AM Scope Withdrawal Time: 0 hours 12 minutes 12 seconds  Total Procedure Duration: 0 hours 13 minutes 43 seconds  Findings:                 The digital rectal exam was normal.                           The colon (entire examined portion) appeared normal.                           Internal hemorrhoids were found during                            retroflexion. The hemorrhoids were small. Complications:            No immediate complications. Estimated Blood Loss:     Estimated blood loss: none. Impression:               - The entire examined colon is normal.                           - Small internal hemorrhoids.                           - No specimens collected. Recommendation:           -  Patient has a contact number available for                            emergencies. The signs and symptoms of potential                            delayed complications were discussed with the                            patient. Return to normal activities tomorrow.                            Written discharge instructions were provided to the                            patient.                           - Resume previous diet.                           - Continue present medications.                           - Repeat colonoscopy in 10 years for surveillance. Beverley Fiedler, MD 04/04/2023 9:02:17 AM This report has been signed electronically.

## 2023-04-04 NOTE — Progress Notes (Signed)
GASTROENTEROLOGY PROCEDURE H&P NOTE   Primary Care Physician: Beverley Fiedler, MD    Reason for Procedure:  History of adenomatous colon polyp  Plan:    Colonoscopy  Patient is appropriate for endoscopic procedure(s) in the ambulatory ([facility]) setting.  The nature of the procedure, as well as the risks, benefits, and alternatives were carefully and thoroughly reviewed with the patient. Ample time for discussion and questions allowed. The patient understood, was satisfied, and agreed to proceed.     HPI: Douglas Kirby is a 67 y.o. male who presents for surveillance colonoscopy.  Medical history as below.  Tolerated the prep.  No recent chest pain or shortness of breath.  No abdominal pain today.  Past Medical History:  Diagnosis Date   Chronic diarrhea    Chronic kidney disease    Kidney stones   Hypercholesterolemia    Loss of hearing 2016   Tubular adenoma of colon     Past Surgical History:  Procedure Laterality Date   COLONOSCOPY     COLONOSCOPY WITH PROPOFOL N/A 11/02/2015   Procedure: COLONOSCOPY WITH PROPOFOL;  Surgeon: Charolett Bumpers, MD;  Location: [facility];  Service: Endoscopy;  Laterality: N/A;   KIDNEY STONE SURGERY     NASAL SEPTUM SURGERY      Prior to Admission medications   Medication Sig Start Date End Date Taking? Authorizing Provider  OVER THE COUNTER MEDICATION Multi magnesium types daily  for recovery focus   Yes [provider]  rifaximin (XIFAXAN) 550 MG TABS tablet Take 1 tablet (550 mg total) by mouth 3 (three) times daily. Patient not taking: Reported on 03/09/2023 12/13/21   Beverley Fiedler, MD  rosuvastatin (CRESTOR) 5 MG tablet Take 1 tablet (5 mg total) by mouth daily. Patient not taking: Reported on 04/04/2023 10/21/21   Kathleene Hazel, MD    Current Outpatient Medications  Medication Sig Dispense Refill   OVER THE COUNTER MEDICATION Multi magnesium types daily  for recovery focus     rifaximin (XIFAXAN) 550 MG  TABS tablet Take 1 tablet (550 mg total) by mouth 3 (three) times daily. (Patient not taking: Reported on 03/09/2023) 42 tablet 0   rosuvastatin (CRESTOR) 5 MG tablet Take 1 tablet (5 mg total) by mouth daily. (Patient not taking: Reported on 04/04/2023) 90 tablet 3   Current Facility-Administered Medications  Medication Dose Route Frequency Provider Last Rate Last Admin   0.9 %  sodium chloride infusion  500 mL Intravenous Once Nachelle Negrette, Carie Caddy, MD        Allergies as of 04/04/2023   (No Known Allergies)    Family History  Problem Relation Age of Onset   Scleroderma Mother    Heart disease Mother    Parkinson's disease Father    CAD Paternal Uncle    Colon cancer Neg Hx    Stomach cancer Neg Hx    Esophageal cancer Neg Hx    Colon polyps Neg Hx    Rectal cancer Neg Hx     Social History   Socioeconomic History   Marital status: Married    Spouse name: Not on file   Number of children: 1   Years of education: Not on file   Highest education level: Not on file  Occupational History   Occupation: Dealer  Tobacco Use   Smoking status: Never   Smokeless tobacco: Never  Vaping Use   Vaping status: Never Used  Substance and Sexual Activity   Alcohol use: Yes  Comment: occasional wine   Drug use: No   Sexual activity: Not on file  Other Topics Concern   Not on file  Social History Narrative   Not on file   Social Determinants of Health   Financial Resource Strain: Not on file  Food Insecurity: Not on file  Transportation Needs: Not on file  Physical Activity: Not on file  Stress: Not on file  Social Connections: Not on file  Intimate Partner Violence: Not on file    Physical Exam: Vital signs in last 24 hours: @BP  (!) 141/92   Pulse (!) 59   Temp 98.2 F (36.8 C)   Ht 5\' 8"  (1.727 m)   Wt 173 lb (78.5 kg)   SpO2 98%   BMI 26.30 kg/m  GEN: NAD EYE: Sclerae anicteric ENT: MMM CV: Non-tachycardic Pulm: CTA b/l GI: Soft, NT/ND NEURO:  Alert  & Oriented x 3   Erick Blinks, MD [facility]  04/04/2023 8:34 AM

## 2023-04-04 NOTE — Patient Instructions (Addendum)
Resume previous diet.  Continue present medications.  Repeat colonoscopy in 10 years for surveillance.   YOU HAD AN ENDOSCOPIC PROCEDURE TODAY AT THE [facility]:   Refer to the procedure report that was given to you for any specific questions about what was found during the examination.  If the procedure report does not answer your questions, please call your gastroenterologist to clarify.  If you requested that your care partner not be given the details of your procedure findings, then the procedure report has been included in a sealed envelope for you to review at your convenience later.  YOU SHOULD EXPECT: Some feelings of bloating in the abdomen. Passage of more gas than usual.  Walking can help get rid of the air that was put into your GI tract during the procedure and reduce the bloating. If you had a lower endoscopy (such as a colonoscopy or flexible sigmoidoscopy) you may notice spotting of blood in your stool or on the toilet paper. If you underwent a bowel prep for your procedure, you may not have a normal bowel movement for a few days.  Please Note:  You might notice some irritation and congestion in your nose or some drainage.  This is from the oxygen used during your procedure.  There is no need for concern and it should clear up in a day or so.  SYMPTOMS TO REPORT IMMEDIATELY:  Following lower endoscopy (colonoscopy or flexible sigmoidoscopy):  Excessive amounts of blood in the stool  Significant tenderness or worsening of abdominal pains  Swelling of the abdomen that is new, acute  Fever of 100F or higher  For urgent or emergent issues, a gastroenterologist can be reached at any hour by calling [phone]. Do not use MyChart messaging for urgent concerns.    DIET:  We do recommend a small meal at first, but then you may proceed to your regular diet.  Drink plenty of fluids but you should avoid alcoholic beverages for 24 hours.  ACTIVITY:  You should plan to  take it easy for the rest of today and you should NOT DRIVE or use heavy machinery until tomorrow (because of the sedation medicines used during the test).    FOLLOW UP: Our staff will call the number listed on your records the next business day following your procedure.  We will call around 7:15- 8:00 am to check on you and address any questions or concerns that you may have regarding the information given to you following your procedure. If we do not reach you, we will leave a message.     If any biopsies were taken you will be contacted by phone or by letter within the next 1-3 weeks.  Please call us at [phone] if you have not heard about the biopsies in 3 weeks.    SIGNATURES/CONFIDENTIALITY: You and/or your care partner have signed paperwork which will be entered into your electronic medical record.  These signatures attest to the fact that that the information above on your After Visit Summary has been reviewed and is understood.  Full responsibility of the confidentiality of this discharge information lies with you and/or your care-partner. 

## 2023-04-05 ENCOUNTER — Telehealth: Payer: Self-pay | Admitting: *Deleted

## 2023-04-05 NOTE — Telephone Encounter (Signed)
  Follow up Call-     04/04/2023    7:54 AM  Call back number  Post procedure Call Back phone  # [phone]  Permission to leave phone message Yes     Patient questions:  Do you have a fever, pain , or abdominal swelling? No. Pain Score  0 *  Have you tolerated food without any problems? Yes.    Have you been able to return to your normal activities? Yes.    Do you have any questions about your discharge instructions: Diet   No. Medications  No. Follow up visit  No.  Do you have questions or concerns about your Care? No.  Actions: * If pain score is 4 or above: No action needed, pain <4.

## 2023-11-18 IMAGING — CT CT HEART MORP W/ CTA COR W/ SCORE W/ CA W/CM &/OR W/O CM
4 of 7 series · 8 of 20 positions shown, 9 images · non-contrast
Comparison: Chest CTA 06/15/2020.
COMPARISON: Chest CTA 06/15/2020.

Addendum:
EXAM:
OVER-READ INTERPRETATION  CT CHEST

The following report is an over-read performed by radiologist Dr.
Kiyoka Iroshan [REDACTED] on 09/16/2021. This
over-read does not include interpretation of cardiac or coronary
anatomy or pathology. The coronary calcium score/coronary CTA
interpretation by the cardiologist is attached.
CLINICAL DATA: 66M with CAD, hyperlipidemia, and exertional
dyspnea.
Cardiac/Coronary  CT
TECHNIQUE: The patient was scanned on a [employer] Force scanner.

[Series 6: ts diast sharp · axial · 0.39mm/px · z∈[+2,+39]mm · 2 of 281 slices shown]
[im 94/281  lung]
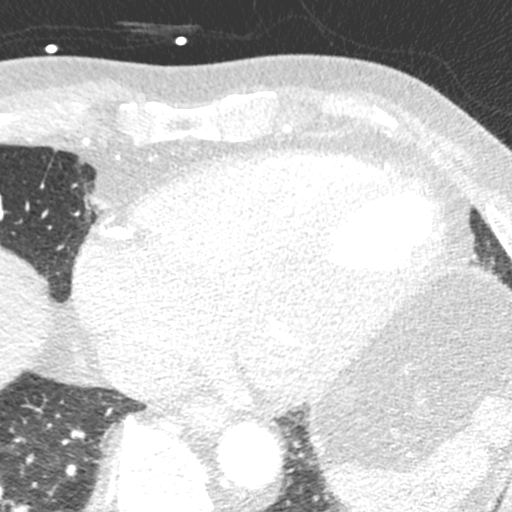
[im 187/281  lung]
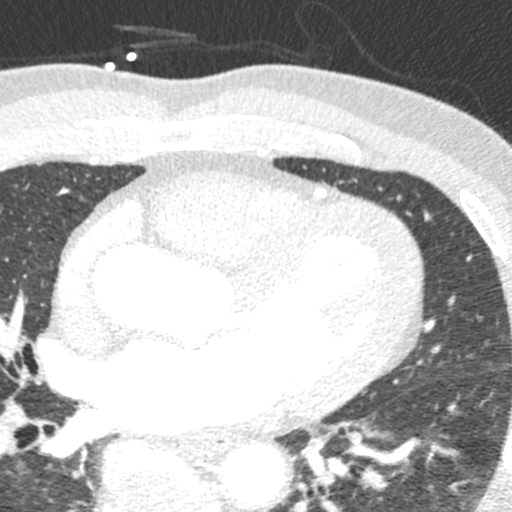

[Series 7: ts syst sharp · axial · 0.39mm/px · z∈[+2,+39]mm · 2 of 281 slices shown]
[im 94/281  lung]
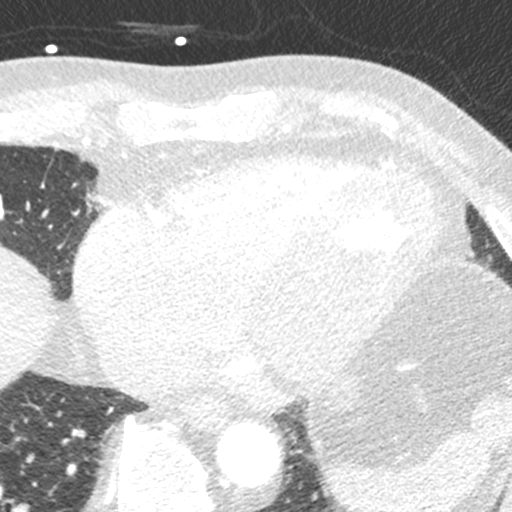
[im 187/281  lung]
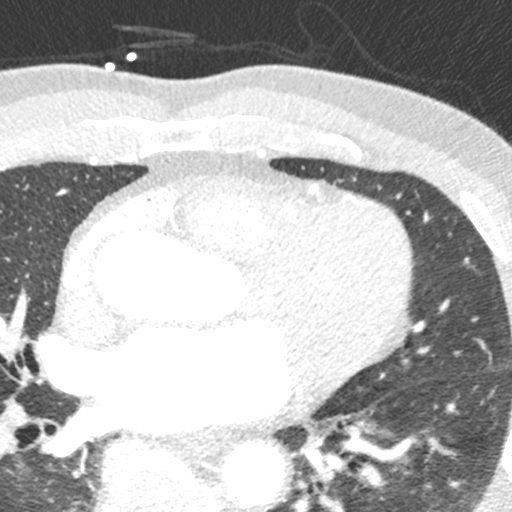

[Series 8: best syst · axial · 0.39mm/px · z∈[+2,+39]mm · 2 of 281 slices shown, 3 images]
[im 94/281  vessel]
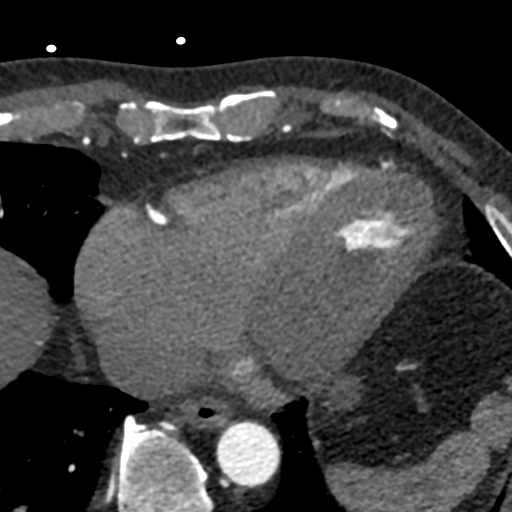
[im 94/281  lung]
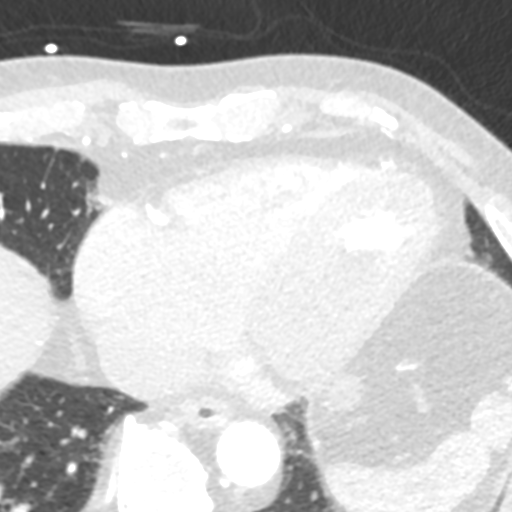
[im 187/281  vessel]
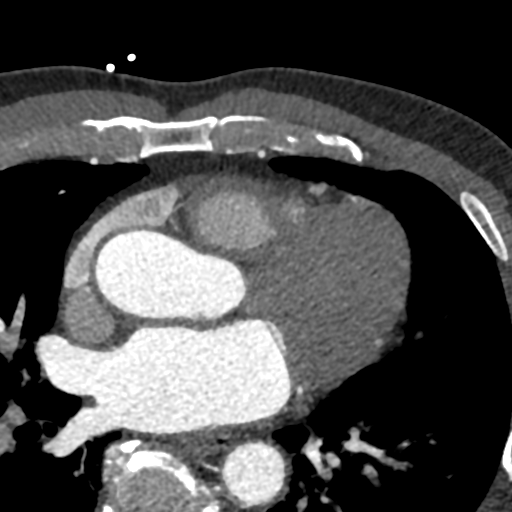

[Series 9: best diast · axial · 0.39mm/px · z∈[+2,+39]mm · 2 of 281 slices shown]
[im 94/281  vessel]
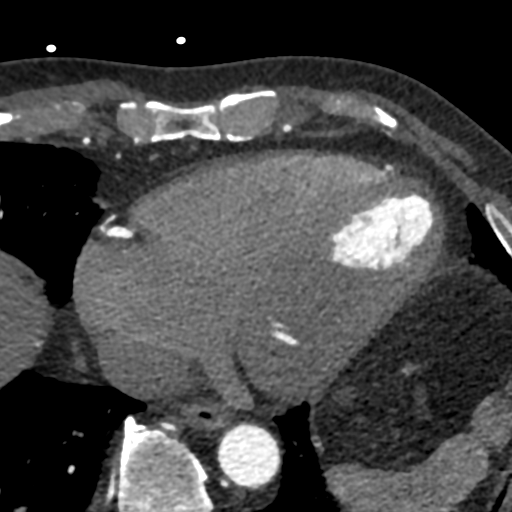
[im 187/281  vessel]
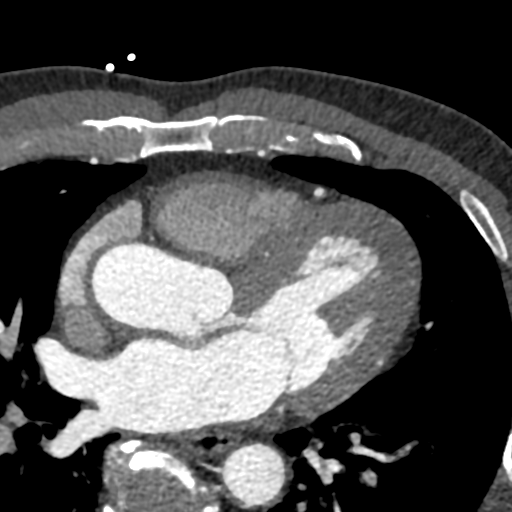

[8 of 20 positions shown; findings below may reference images not displayed]

FINDINGS: Atherosclerotic calcifications in the thoracic aorta. Within the
visualized portions of the thorax there are no suspicious appearing
pulmonary nodules or masses, there is no acute consolidative
airspace disease, no pleural effusions, no pneumothorax and no
lymphadenopathy. Visualized portions of the upper abdomen are
unremarkable. There are no aggressive appearing lytic or blastic
lesions noted in the visualized portions of the skeleton.
IMPRESSION: 1.  Aortic Atherosclerosis (PEQKY-0OI.I).
FINDINGS: A 120 kV prospective scan was triggered in the descending thoracic
aorta at 111 HU's. Axial non-contrast 3 mm slices were carried out
through the heart. The data set was analyzed on a dedicated work
station and scored using the Agatson method. Gantry rotation speed
was 250 msecs and collimation was .6 mm. No beta blockade and 0.8 mg
of sl NTG was given. The 3D data set was reconstructed in 5%
intervals of the 67-82 % of the R-R cycle. Diastolic phases were
analyzed on a dedicated work station using MPR, MIP and VRT modes.
The patient received 80 cc of contrast.

Aorta: Normal size. Ascending aorta 3.5 cm. No calcifications. No
dissection.

Aortic Valve:  Trileaflet.  No calcifications.

Coronary Arteries:  Normal coronary origin.  Right dominance.

RCA is a large dominant artery that gives rise to PDA and PLVB.
There is minimal (<25%) calcified plaque proximally.

Left main is a large artery that gives rise to LAD, RI, and LCX
arteries.

LAD is a large vessel that has minimal (<25%) calcified plaque in
the mid LAD and mild (25-49%) calcified plaque in the distal LAD. D1
is a large branching vessel with minimal (<25%) calcified plaque
proximally.

LCX is a non-dominant artery that gives rise to one large OM1
branch. There is no plaque.

Coronary Calcium Score:

Left main: 0

Left anterior descending artery: 53

Left circumflex artery:

Right coronary artery:

Total:

Percentile: 42nd

Other findings:

Normal pulmonary vein drainage into the left atrium.

Normal let atrial appendage without a thrombus.

Normal size of the pulmonary artery.
IMPRESSION: 1. Coronary calcium score of 62.9. This was 42nd percentile for
age-, race-, and sex-matched controls.

2. Normal coronary origin with right dominance.

3. Minimal plaque in the RCA, mid LAD and D1. Mild (25-49%) Plaque
in the distal LAD.

4. Recommend aggressive risk factor modification, including LDL goal
<70.

*** End of Addendum ***
EXAM:
OVER-READ INTERPRETATION  CT CHEST

The following report is an over-read performed by radiologist Dr.
Kiyoka Iroshan [REDACTED] on 09/16/2021. This
over-read does not include interpretation of cardiac or coronary
anatomy or pathology. The coronary calcium score/coronary CTA
interpretation by the cardiologist is attached.
FINDINGS: Atherosclerotic calcifications in the thoracic aorta. Within the
visualized portions of the thorax there are no suspicious appearing
pulmonary nodules or masses, there is no acute consolidative
airspace disease, no pleural effusions, no pneumothorax and no
lymphadenopathy. Visualized portions of the upper abdomen are
unremarkable. There are no aggressive appearing lytic or blastic
lesions noted in the visualized portions of the skeleton.
IMPRESSION: 1.  Aortic Atherosclerosis (PEQKY-0OI.I).

## 2024-01-01 ENCOUNTER — Telehealth: Payer: Self-pay | Admitting: Cardiovascular Disease

## 2024-01-01 NOTE — Telephone Encounter (Signed)
 Patient is calling because he would like to have a full blood panel and urine panel before his upcoming appointment in July. Patient is requesting a call back once orders have been placed.

## 2024-01-01 NOTE — Telephone Encounter (Signed)
 Called and spoke with patient, shared Dr. Jeneal Mins response with him:  Please let him know that I don't do general full panels or U/As as part of his cardiac follow up. He should be seeing primary care to have screening blood work and urine tests. We will decide if any other labs are necessary when I see him. Douglas Kirby   Patient states he does not have a PCP, he only sees Dr. Abel Hoe and Dr. Bridgett Camps.   Patient states he wanted to follow-up on his cholesterol numbers. Reminded him Dr. Abel Hoe will order any necessary labs after office visit in July.  Patient verbalized understanding and expressed appreciation for follow-up.

## 2024-02-18 NOTE — Progress Notes (Unsigned)
 Cardiology Office Note   Date:  02/20/2024  ID:  KORTNEY SCHOENFELDER, DOB September 10, 1955, MRN [id number] PCP: Albertus Gordy HERO, MD  [facility] Providers Cardiologist:  Lonni Cash, MD {    History of Present Illness Douglas Kirby is a 68 y.o. male with a past medical history of hyperlipidemia, chronic diarrhea, and adenomatous colon polyp and nephrolithiasis who is here today for follow-up visit.  The patient was last seen in January 2023.  History includes evaluated October 2019 for evaluation of abnormal chest CT which showed coronary artery calcifications.  Had CT for follow-up of pulmonary nodules and coronary calcifications was noted.  Negative exercise test 2017.  Has been active and is a runner.  Rare chest pains on the right side.  Never with exertion.  He had dyspnea when climbing stairs.  Echo October 2019 with LVEF 65% with normal wall motion.  The aortic root was dilated.  Nuclear stress test November 2019 with no ischemia.  Chest CT November 2019 with mild ectasia of the aortic root at 4.0 cm.  When he was last seen he was doing well and denied any chest pain, lower extremity edema, orthopnea, PND, dizziness, syncope or near syncope.  He was having dyspnea on exertion.  He was feeling his heart race every morning.  Not sure what his heart rate is at that time.  Today, he presents with coronary artery disease for cardiovascular evaluation and management.  He has coronary artery calcification in the left anterior descending artery and right coronary artery branches, with 25-49% calcification on CT scan. He occasionally takes Bayer aspirin  but is inconsistent. He has elevated LDL cholesterol and a family history of heart issues.  He experiences nocturnal supraventricular tachycardia, identified during a 14-day heart monitor test a year ago. He discontinued metoprolol  due to muscle cramps and other side effects.  He is concerned about weight, stress levels, and low  testosterone. He aims to reduce meal and alcohol intake. He runs 10-15 miles weekly, though his running group has decreased in size. He has a history of athletic activity, including football and track.  His family history includes his mother passing away from scleroderma with internal calcification. He experiences cold extremities in winter and uses an infrared sauna for symptom management.  We discussed non-statin options for LDL reduction and would like to try Leqvio.   Reports no shortness of breath nor dyspnea on exertion. Reports no chest pain, pressure, or tightness. No edema, orthopnea, PND.   Discussed the use of AI scribe software for clinical note transcription with the patient, who gave verbal consent to proceed.   ROS: pertinent ROS in HPI  Studies Reviewed      Echo October 2019: - Left ventricle: The cavity size was normal. Systolic function was    normal. The estimated ejection fraction was in the range of 60%    to 65%. Wall motion was normal; there were no regional wall    motion abnormalities.  - Aortic valve: Trileaflet; normal thickness, mildly calcified    leaflets.  - Aorta: Aortic root dimension: 44 mm (ED). Ascending aortic    diameter: 39 mm (S).  - Aortic root: The aortic root was moderately dilated.  - Ascending aorta: The ascending aorta was mildly dilated.  - Mitral valve: Calcified annulus.  - Tricuspid valve: There was trivial regurgitation.  - Pulmonic valve: There was trivial regurgitation.    Physical Exam VS:  BP 130/74 (BP Location: Left Arm, Patient Position: Sitting, Cuff  Size: Normal)   Pulse (!) 50   Ht 5' 8 (1.727 m)   Wt 196 lb 3.2 oz (89 kg)   SpO2 98%   BMI 29.83 kg/m        Wt Readings from Last 3 Encounters:  02/20/24 196 lb 3.2 oz (89 kg)  04/04/23 173 lb (78.5 kg)  03/09/23 173 lb (78.5 kg)    GEN: Well nourished, well developed in no acute distress NECK: No JVD; No carotid bruits CARDIAC: RRR, no murmurs, rubs,  gallops RESPIRATORY:  Clear to auscultation without rales, wheezing or rhonchi  ABDOMEN: Soft, non-tender, non-distended EXTREMITIES:  No edema; No deformity   ASSESSMENT AND PLAN  Coronary artery disease with mild calcification Mild calcification in the LAD and RCA with 25-49% stenosis. Family history and elevated LDL cholesterol increase risk. - Initiate daily low-dose aspirin  (81 mg), prefer enteric-coated for gastric protection.  Hyperlipidemia Elevated LDL cholesterol increases coronary artery disease risk. - Submit prior authorization for Leqvio, a twice-yearly injection. - Order baseline lipid panel before starting [place].  Supraventricular tachycardia/Palpitations Episodes occur at night. Previous metoprolol  trial discontinued due to muscle cramps. Patient has been taking baby aspirin  occasionally but not regularly. - Discussed SVT nature and distinction from AFib. -bradycardia today in the office, holding nodal blockers for now  General Health Maintenance Routine health maintenance including regular exams and kidney function monitoring. - Perform blood work today to check kidney function and cholesterol levels.      Dispo: He can follow-up in a year with Dr. Verlin   Signed, Orren LOISE Fabry, PA-C

## 2024-02-20 ENCOUNTER — Ambulatory Visit: Attending: Internal Medicine | Admitting: Physician Assistant

## 2024-02-20 VITALS — BP 130/74 | HR 50 | Ht 68.0 in | Wt 196.2 lb

## 2024-02-20 DIAGNOSIS — R002 Palpitations: Secondary | ICD-10-CM | POA: Insufficient documentation

## 2024-02-20 DIAGNOSIS — E785 Hyperlipidemia, unspecified: Secondary | ICD-10-CM | POA: Insufficient documentation

## 2024-02-20 DIAGNOSIS — I712 Thoracic aortic aneurysm, without rupture, unspecified: Secondary | ICD-10-CM | POA: Diagnosis not present

## 2024-02-20 DIAGNOSIS — Z79899 Other long term (current) drug therapy: Secondary | ICD-10-CM | POA: Insufficient documentation

## 2024-02-20 DIAGNOSIS — I2510 Atherosclerotic heart disease of native coronary artery without angina pectoris: Secondary | ICD-10-CM | POA: Diagnosis not present

## 2024-02-20 LAB — LIPID PANEL

## 2024-02-20 MED ORDER — ASPIRIN 81 MG PO TBEC: 81.0000 mg | DELAYED_RELEASE_TABLET | Freq: Every day | ORAL | Status: AC

## 2024-02-20 NOTE — Patient Instructions (Addendum)
 Medication Instructions:  START Aspirin  81 mg once daily.   Lab Work: Lipid panel and CMET today. Downstairs on the first floor  Follow-Up:  Your next appointment:   1 year(s)  Provider:   Lonni Cash, MD    We recommend signing up for the patient portal called MyChart.  Sign up information is provided on this After Visit Summary.  MyChart is used to connect with patients for Virtual Visits (Telemedicine).  Patients are able to view lab/test results, encounter notes, upcoming appointments, etc.  Non-urgent messages can be sent to your provider as well.   To learn more about what you can do with MyChart, go to [email or web address].   Other Instructions You have been referred to PharmD for consideration of Leqvio injections.

## 2024-02-21 ENCOUNTER — Encounter: Payer: Self-pay | Admitting: Pharmacist Clinician (PhC)/ Clinical Pharmacy Specialist

## 2024-02-21 ENCOUNTER — Telehealth: Payer: Self-pay

## 2024-02-21 ENCOUNTER — Ambulatory Visit: Payer: Self-pay | Admitting: Physician Assistant

## 2024-02-21 ENCOUNTER — Encounter: Payer: Self-pay | Admitting: Physician Assistant

## 2024-02-21 ENCOUNTER — Ambulatory Visit: Attending: Cardiology | Admitting: Pharmacist Clinician (PhC)/ Clinical Pharmacy Specialist

## 2024-02-21 DIAGNOSIS — E785 Hyperlipidemia, unspecified: Secondary | ICD-10-CM | POA: Diagnosis present

## 2024-02-21 LAB — LIPID PANEL
Cholesterol, Total: 233 mg/dL — AB (ref 100–199)
HDL: 54 mg/dL (ref >39)
LDL CALC COMMENT:: 4.3 ratio (ref 0.0–5.0)
LDL Chol Calc (NIH): 159 mg/dL — AB (ref 0–99)
Triglycerides: 110 mg/dL (ref 0–149)
VLDL Cholesterol Cal: 20 mg/dL (ref 5–40)

## 2024-02-21 LAB — COMPREHENSIVE METABOLIC PANEL WITH GFR
ALT: 32 IU/L (ref 0–44)
AST: 24 IU/L (ref 0–40)
Albumin: 4.6 g/dL (ref 3.9–4.9)
Alkaline Phosphatase: 53 IU/L (ref 44–121)
BUN/Creatinine Ratio: 14 (ref 10–24)
BUN: 15 mg/dL (ref 8–27)
Bilirubin Total: 0.7 mg/dL (ref 0.0–1.2)
CO2: 24 mmol/L (ref 20–29)
Calcium: 9.5 mg/dL (ref 8.6–10.2)
Chloride: 104 mmol/L (ref 96–106)
Creatinine, Ser: 1.1 mg/dL (ref 0.76–1.27)
Globulin, Total: 2.5 g/dL (ref 1.5–4.5)
Glucose: 112 mg/dL — AB (ref 70–99)
Potassium: 4.7 mmol/L (ref 3.5–5.2)
Sodium: 142 mmol/L (ref 134–144)
Total Protein: 7.1 g/dL (ref 6.0–8.5)
eGFR: 73 mL/min/1.73 (ref >59)

## 2024-02-21 NOTE — Progress Notes (Signed)
   Office Visit    Patient Name: Douglas Kirby Date of Encounter: 02/21/2024  Primary Care Provider:  Albertus Gordy HERO, MD Primary Cardiologist:  Lonni Cash, MD  Chief Complaint    Hyperlipidemia   Significant Past Medical History   CAD 25-49% LAD, RCA stenoses  SVT Mostly at night, currently no nodal blockade     No Known Allergies  History of Present Illness    Douglas Kirby is a 68 y.o. male patient of Dr Cash, in the office today to discuss options for cholesterol management.  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; swim daily, stair laps    Accessory Clinical Findings   Lab Results  Component Value Date   CHOL 233 (H) 02/20/2024   HDL 54 02/20/2024   LDLCALC 159 (H) 02/20/2024   TRIG 110 02/20/2024   CHOLHDL 4.3 02/20/2024    No results found for: LIPOA  Lab Results  Component Value Date   ALT 32 02/20/2024   AST 24 02/20/2024   ALKPHOS 53 02/20/2024   BILITOT 0.7 02/20/2024   Lab Results  Component Value Date   CREATININE 1.10 02/20/2024   BUN 15 02/20/2024   NA 142 02/20/2024   K 4.7 02/20/2024   CL 104 02/20/2024   CO2 24 02/20/2024   No results found for: HGBA1C  Home Medications    Current Outpatient Medications  Medication Sig Dispense Refill   aspirin  EC 81 MG tablet Take 1 tablet (81 mg total) by mouth daily. Swallow whole.     OVER THE COUNTER MEDICATION Multi magnesium types daily  for recovery focus (Patient not taking: Reported on  02/21/2024)     rifaximin  (XIFAXAN ) 550 MG TABS tablet Take 1 tablet (550 mg total) by mouth 3 (three) times daily. (Patient not taking: Reported on 02/20/2024) 42 tablet 0   rosuvastatin  (CRESTOR ) 5 MG tablet Take 1 tablet (5 mg total) by mouth daily. (Patient not taking: Reported on 02/20/2024) 90 tablet 3   No current facility-administered medications for this visit.     Assessment & Plan    Hyperlipidemia LDL goal <70 Assessment: Patient with CAD not at LDL goal of < 70 Most recent LDL 156 on 02/20/24 Not able to tolerate statins secondary to myalgias - atorvastatin , rosuvastatin  Patient not interested in self injection, would specifically like to try Leqvio.  Reviewed mechanism of action, side effects, dosing schedule and cost concerns.   Plan: Patient agreeable to starting Leqvio Repeat labs about 1 month after second dose Lipid Liver function   Allean Mink, PharmD CPP [facility] [address]   [place], [place] [address] [phone]  02/21/2024, 11:43 AM

## 2024-02-21 NOTE — Telephone Encounter (Signed)
 Dr. Verlin and Kristin, patient will be scheduled as soon as possible.  Auth Submission: NO AUTH NEEDED Site of care: Site of care: [facility] Payer: Medicare A/B with BCBS supplement Medication & CPT/J Code(s) submitted: Leqvio (Inclisiran) J1306 Diagnosis Code:  Route of submission (phone, fax, portal):  Phone # Fax # Auth type: Buy/Bill PB Units/visits requested: 284mg  x 2 doses Reference number:  Approval from: 02/21/24 to 08/24/24

## 2024-02-21 NOTE — Assessment & Plan Note (Signed)
 Assessment: Patient with CAD not at LDL goal of < 70 Most recent LDL 156 on 02/20/24 Not able to tolerate statins secondary to myalgias - atorvastatin , rosuvastatin  Patient not interested in self injection, would specifically like to try Leqvio.  Reviewed mechanism of action, side effects, dosing schedule and cost concerns.   Plan: Patient agreeable to starting Leqvio Repeat labs about 1 month after second dose Lipid Liver function

## 2024-02-21 NOTE — Patient Instructions (Signed)
 Your Results:             Your most recent labs Goal  Total Cholesterol 233 < 200  Triglycerides 110 < 150  HDL (happy/good cholesterol) 54 > 40  LDL (lousy/bad cholesterol 159 < 70   Medication changes:  We will start the process to get Leqvio covered by your insurance.  Once the prior authorization is complete, I will call/send a MyChart message to let you know and confirm pharmacy information.   You will take first 2 doses 3 minutes apart,   Lab orders:  We want to repeat labs after 4 months.  We will send you a lab order to remind you once we get closer to that time.     Thank you for choosing [facility]

## 2024-02-23 ENCOUNTER — Ambulatory Visit (INDEPENDENT_AMBULATORY_CARE_PROVIDER_SITE_OTHER)

## 2024-02-23 VITALS — BP 136/77 | HR 45 | Temp 97.8°F | Resp 16 | Ht 68.0 in | Wt 194.8 lb

## 2024-02-23 DIAGNOSIS — E785 Hyperlipidemia, unspecified: Secondary | ICD-10-CM | POA: Diagnosis not present

## 2024-02-23 MED ORDER — INCLISIRAN SODIUM 284 MG/1.5ML ~~LOC~~ SOSY
284.0000 mg | PREFILLED_SYRINGE | Freq: Once | SUBCUTANEOUS | Status: AC
Start: 2024-02-23 — End: 2024-02-23
  Administered 2024-02-23: 284 mg via SUBCUTANEOUS
  Filled 2024-02-23: qty 1.5

## 2024-02-23 NOTE — Progress Notes (Signed)
 Diagnosis: Hyperlipidemia  Provider:  Mannam, Praveen MD  Procedure: Injection  Leqvio  (inclisiran), Dose: 284 mg, Site: subcutaneous, Number of injections: 1  Injection Site(s): Left arm  Post Care: Observation period completed  Discharge: Condition: Good, Destination: Home . AVS Declined  Performed by:  Lauran Pollard, LPN

## 2024-03-13 ENCOUNTER — Other Ambulatory Visit: Payer: Self-pay | Admitting: Pharmacist Clinician (PhC)/ Clinical Pharmacy Specialist

## 2024-05-27 ENCOUNTER — Ambulatory Visit (INDEPENDENT_AMBULATORY_CARE_PROVIDER_SITE_OTHER)

## 2024-05-27 VITALS — BP 144/79 | HR 56 | Temp 97.7°F | Resp 18 | Ht 68.0 in | Wt 199.2 lb

## 2024-05-27 DIAGNOSIS — E785 Hyperlipidemia, unspecified: Secondary | ICD-10-CM | POA: Diagnosis not present

## 2024-05-27 MED ORDER — INCLISIRAN SODIUM 284 MG/1.5ML ~~LOC~~ SOSY
284.0000 mg | PREFILLED_SYRINGE | Freq: Once | SUBCUTANEOUS | Status: AC
  Administered 2024-05-27: 284 mg via SUBCUTANEOUS
  Filled 2024-05-27: qty 1.5

## 2024-05-27 NOTE — Progress Notes (Signed)
 Diagnosis: Hyperlipidemia  Provider:  Chilton Greathouse MD  Procedure: Injection  Leqvio (inclisiran), Dose: 284 mg, Site: subcutaneous, Number of injections: 1  Injection Site(s): Right arm  Post Care: Patient declined observation  Discharge: Condition: Good, Destination: Home . AVS Declined  Performed by:  Wyvonne Lenz, RN

## 2024-08-05 ENCOUNTER — Telehealth: Payer: Self-pay

## 2024-08-05 NOTE — Telephone Encounter (Signed)
 Auth Submission: NO AUTH NEEDED Site of care: Site of care: [facility] Payer: Medicare A/B with BCBS supplement Medication & CPT/J Code(s) submitted: Leqvio  (Inclisiran) J1306 Diagnosis Code:  Route of submission (phone, fax, portal):  Phone # Fax # Auth type: Buy/Bill PB Units/visits requested: 284mg  x 2 doses Reference number:  Approval from: 08/05/24 to 08/24/25

## 2024-11-25 ENCOUNTER — Ambulatory Visit
